# Patient Record
Sex: Male | Born: 1970 | Race: Black or African American | Hispanic: No | Marital: Married | Smoking: Never smoker
Health system: Southern US, Community
[De-identification: ages and names within clinical notes are randomized; demographics above are authoritative.]

## PROBLEM LIST (undated history)

## (undated) DIAGNOSIS — I10 Essential (primary) hypertension: Secondary | ICD-10-CM

## (undated) HISTORY — DX: Essential (primary) hypertension: I10

---

## 2012-06-09 ENCOUNTER — Other Ambulatory Visit: Payer: Self-pay | Admitting: Family Medicine

## 2012-06-10 NOTE — Telephone Encounter (Signed)
Patient's chart is at the nurses station in the pa pool pile.  [facility] [id number]

## 2012-06-10 NOTE — Telephone Encounter (Signed)
Need chart

## 2012-08-18 ENCOUNTER — Other Ambulatory Visit: Payer: Self-pay | Admitting: Physician Assistant

## 2012-08-18 NOTE — Telephone Encounter (Signed)
Please pull chart.

## 2012-08-20 NOTE — Telephone Encounter (Signed)
Needs OV, not seen in Epic - 2nd notice

## 2012-08-20 NOTE — Telephone Encounter (Signed)
Chart pulled to PA pool at nurses station [phone]

## 2013-01-18 ENCOUNTER — Encounter: Payer: Self-pay | Admitting: Family Medicine

## 2013-01-18 ENCOUNTER — Ambulatory Visit (INDEPENDENT_AMBULATORY_CARE_PROVIDER_SITE_OTHER): Payer: BC Managed Care – PPO | Admitting: Family Medicine

## 2013-01-18 VITALS — BP 140/96 | HR 81 | Temp 99.1°F | Resp 16 | Ht 77.0 in | Wt 323.4 lb

## 2013-01-18 DIAGNOSIS — E669 Obesity, unspecified: Secondary | ICD-10-CM

## 2013-01-18 DIAGNOSIS — I10 Essential (primary) hypertension: Secondary | ICD-10-CM

## 2013-01-18 MED ORDER — LISINOPRIL-HYDROCHLOROTHIAZIDE 20-25 MG PO TABS: 1.0000 | ORAL_TABLET | Freq: Every day | ORAL | Status: DC

## 2013-01-18 NOTE — Patient Instructions (Addendum)
Obesity Obesity is having too much body fat and a body mass index (BMI) of 30 or more. BMI is a number based on your height and weight. The number is an estimate of how much body fat you have. Obesity can happen if you eat more calories than you can burn by exercising or other activity. It can cause major health problems or emergencies.  HOME CARE  Exercise and be active as told by your doctor. Try:  Using stairs when you can.  Parking farther away from store doors.  Gardening, biking, or walking.  Eat healthy foods and drinks that are low in calories. Eat more fruits and vegetables.  Limit fast food, sweets, and snack foods that are made with ingredients that are not natural (processed food).  Eat smaller amounts of food.  Keep a journal and write down what you eat every day. Websites can help with this.  Avoid drinking alcohol. Drink more water and drinks without calories.   Take vitamins and dietary pills (supplements) only as told by your doctor.  Try going to weight-loss support groups or classes to help lessen stress. Dieticians and counselors may also help. GET HELP RIGHT AWAY IF:  You have chest pain or tightness.  You have trouble breathing or feel short of breath.  You feel weak or have loss of feeling (numbness) in your legs.  You feel confused or have trouble talking.  You have sudden changes in your vision. MAKE SURE YOU:  Understand these instructions.  Will watch your condition.  Will get help right away if you are not doing well or get worse. Document Released: 09/19/2011 Document Reviewed: 09/19/2011 ExitCare Patient Information 2014 [employer].  

## 2013-01-20 DIAGNOSIS — I10 Essential (primary) hypertension: Secondary | ICD-10-CM | POA: Insufficient documentation

## 2013-01-20 NOTE — Progress Notes (Signed)
S:  This 42 y.o. 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 He has elevated BP and was advised to follow-up for better HTN control. Pt has been complaint w/ medication and he and wife have instituted lifestyle changes (diet and exercise). Pt denies diaphoresis, fatigue, CP or tightness, palpitations, edema, HA, dizziness, numbness, weakness, vision changes or syncope.  PMHX, Soc Hx and Fam Hx reviewed.  ROS: As per HPI.  O:  Filed Vitals:   01/18/13 1007  BP: 140/96  Pulse: 81  Temp: 99.1 F (37.3 C)  Resp: 16   GEN: in NAD. WN,WD. HENT: [place]/AT. EOMI w/ clear conj and muddy sclerae. Otherwise unremarkable. COR: RRR. LUNGS: CTA. Normal resp rate and effort. MS: MAEs; no c/c/e. NEURO: A&O x 3; CNs intact. Nonfocal.  A/P: HTN (hypertension)- Continue current medication and lifestyle changes.  Obesity, unspecified- Encouraged weight loss.

## 2013-01-30 ENCOUNTER — Ambulatory Visit (INDEPENDENT_AMBULATORY_CARE_PROVIDER_SITE_OTHER): Payer: BC Managed Care – PPO | Admitting: Family Medicine

## 2013-01-30 VITALS — BP 147/93 | HR 91 | Temp 98.0°F | Resp 18 | Ht 76.0 in | Wt 321.0 lb

## 2013-01-30 DIAGNOSIS — I10 Essential (primary) hypertension: Secondary | ICD-10-CM

## 2013-01-30 DIAGNOSIS — M109 Gout, unspecified: Secondary | ICD-10-CM

## 2013-01-30 LAB — BASIC METABOLIC PANEL
BUN: 16 mg/dL (ref 6–23)
CO2: 33 mEq/L — ABNORMAL HIGH (ref 19–32)
Calcium: 9.8 mg/dL (ref 8.4–10.5)
Chloride: 100 mEq/L (ref 96–112)
Creat: 1.55 mg/dL — ABNORMAL HIGH (ref 0.50–1.35)
Glucose, Bld: 96 mg/dL (ref 70–99)
Potassium: 3.9 mEq/L (ref 3.5–5.3)
Sodium: 140 mEq/L (ref 135–145)

## 2013-01-30 LAB — URIC ACID: Uric Acid, Serum: 7.7 mg/dL (ref 4.0–7.8)

## 2013-01-30 MED ORDER — COLCHICINE 0.6 MG PO TABS: 0.6000 mg | ORAL_TABLET | Freq: Every day | ORAL | Status: DC

## 2013-01-30 NOTE — Patient Instructions (Signed)
Gout  Gout is an inflammatory condition (arthritis) caused by a buildup of uric acid crystals in the joints. Uric acid is a chemical that is normally present in the blood. Under some circumstances, uric acid can form into crystals in your joints. This causes joint redness, soreness, and swelling (inflammation). Repeat attacks are common. Over time, uric acid crystals can form into masses (tophi) near a joint, causing disfigurement. Gout is treatable and often preventable.  CAUSES   The disease begins with elevated levels of uric acid in the blood. Uric acid is produced by your body when it breaks down a naturally found substance called purines. This also happens when you eat certain foods such as meats and fish. Causes of an elevated uric acid level include:   Being passed down from parent to child (heredity).   Diseases that cause increased uric acid production (obesity, psoriasis, some cancers).   Excessive alcohol use.   Diet, especially diets rich in meat and seafood.   Medicines, including certain cancer-fighting drugs (chemotherapy), diuretics, and aspirin.   Chronic kidney disease. The kidneys are no longer able to remove uric acid well.   Problems with metabolism.  Conditions strongly associated with gout include:   Obesity.   High blood pressure.   High cholesterol.   Diabetes.  Not everyone with elevated uric acid levels gets gout. It is not understood why some people get gout and others do not. Surgery, joint injury, and eating too much of certain foods are some of the factors that can lead to gout.  SYMPTOMS    An attack of gout comes on quickly. It causes intense pain with redness, swelling, and warmth in a joint.   Fever can occur.   Often, only one joint is involved. Certain joints are more commonly involved:   Base of the big toe.   Knee.   Ankle.   Wrist.   Finger.  Without treatment, an attack usually goes away in a few days to weeks. Between attacks, you usually will not have  symptoms, which is different from many other forms of arthritis.  DIAGNOSIS   Your caregiver will suspect gout based on your symptoms and exam. Removal of fluid from the joint (arthrocentesis) is done to check for uric acid crystals. Your caregiver will give you a medicine that numbs the area (local anesthetic) and use a needle to remove joint fluid for exam. Gout is confirmed when uric acid crystals are seen in joint fluid, using a special microscope. Sometimes, blood, urine, and X-ray tests are also used.  TREATMENT   There are 2 phases to gout treatment: treating the sudden onset (acute) attack and preventing attacks (prophylaxis).  Treatment of an Acute Attack   Medicines are used. These include anti-inflammatory medicines or steroid medicines.   An injection of steroid medicine into the affected joint is sometimes necessary.   The painful joint is rested. Movement can worsen the arthritis.   You may use warm or cold treatments on painful joints, depending which works best for you.   Discuss the use of coffee, vitamin C, or cherries with your caregiver. These may be helpful treatment options.  Treatment to Prevent Attacks  After the acute attack subsides, your caregiver may advise prophylactic medicine. These medicines either help your kidneys eliminate uric acid from your body or decrease your uric acid production. You may need to stay on these medicines for a very long time.  The early phase of treatment with prophylactic medicine can be associated   with an increase in acute gout attacks. For this reason, during the first few months of treatment, your caregiver may also advise you to take medicines usually used for acute gout treatment. Be sure you understand your caregiver's directions.  You should also discuss dietary treatment with your caregiver. Certain foods such as meats and fish can increase uric acid levels. Other foods such as dairy can decrease levels. Your caregiver can give you a list of foods  to avoid.  HOME CARE INSTRUCTIONS    Do not take aspirin to relieve pain. This raises uric acid levels.   Only take over-the-counter or prescription medicines for pain, discomfort, or fever as directed by your caregiver.   Rest the joint as much as possible. When in bed, keep sheets and blankets off painful areas.   Keep the affected joint raised (elevated).   Use crutches if the painful joint is in your leg.   Drink enough water and fluids to keep your urine clear or pale yellow. This helps your body get rid of uric acid. Do not drink alcoholic beverages. They slow the passage of uric acid.   Follow your caregiver's dietary instructions. Pay careful attention to the amount of protein you eat. Your daily diet should emphasize fruits, vegetables, whole grains, and fat-free or low-fat milk products.   Maintain a healthy body weight.  SEEK MEDICAL CARE IF:    You have an oral temperature above 102 F (38.9 C).   You develop diarrhea, vomiting, or any side effects from medicines.   You do not feel better in 24 hours, or you are getting worse.  SEEK IMMEDIATE MEDICAL CARE IF:    Your joint becomes suddenly more tender and you have:   Chills.   An oral temperature above 102 F (38.9 C), not controlled by medicine.  MAKE SURE YOU:    Understand these instructions.   Will watch your condition.   Will get help right away if you are not doing well or get worse.  Document Released: 06/24/2000 Document Revised: 09/19/2011 Document Reviewed: 10/05/2009  ExitCare Patient Information 2014 [employer].

## 2013-01-30 NOTE — Progress Notes (Signed)
[facility]:  Office Visit  Chief Complaint:  Chief Complaint  Patient presents with  . Toe Pain    right, since Sunday    HPI: Derrick Cain is a 42 y.o. male who complains of  Right big toe,painful. He has had gout before in 2009. Has taken ibuprofen and also drinking and cherry products to help with gout, it helps some. Marland Kitchen He has constant sharp, dull, aching pain in big toes,it is worse with weight bearing but is constant. NKI. 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 He is supposed to come back in September with Dr. Audria Nine. He has gained weight 20 lbs. Since starting 9 pm- 6 am shift at [employer]. Does not take BP at home  Past Medical History  Diagnosis Date  . Hypertension    History reviewed. No pertinent past surgical history. History   Social History  . Marital Status: Married    Spouse Name: N/A    Number of Children: N/A  . Years of Education: N/A   Occupational History  . [employer]   Social History Main Topics  . Smoking status: Never Smoker   . Smokeless tobacco: None  . Alcohol Use: No  . Drug Use: No  . Sexually Active: Yes   Other Topics Concern  . None   Social History Narrative   Married with children. Education: [employer].    Family History  Problem Relation Age of Onset  . Hypertension Mother   . Diabetes Father    No Known Allergies Prior to Admission medications   Medication Sig Start Date End Date Taking? Authorizing Provider  lisinopril-hydrochlorothiazide (PRINZIDE,ZESTORETIC) 20-25 MG per tablet Take 1 tablet by mouth daily. 01/18/13   Maurice March, MD     ROS: The patient denies fevers, chills, night sweats, unintentional weight loss, chest pain, palpitations, wheezing, dyspnea on exertion, nausea, vomiting, abdominal pain, dysuria, hematuria, melena, numbness, weakness, or tingling.  All other systems have been reviewed and were  otherwise negative with the exception of those mentioned in the HPI and as above.    PHYSICAL EXAM: Filed Vitals:   01/30/13 0917  BP: 147/93  Pulse: 91  Temp: 98 F (36.7 C)  Resp: 18   Filed Vitals:   01/30/13 0917  Height: 6\' 4"  (1.93 m)  Weight: 321 lb (145.605 kg)   Body mass index is 39.09 kg/(m^2).  General: Alert, no acute distress HEENT:  Normocephalic, atraumatic, oropharynx patent.  Cardiovascular:  Regular rate and rhythm, no rubs murmurs or gallops.  No Carotid bruits, radial pulse intact. No pedal edema.  Respiratory: Clear to auscultation bilaterally.  No wheezes, rales, or rhonchi.  No cyanosis, no use of accessory musculature GI: No organomegaly, abdomen is soft and non-tender, positive bowel sounds.  No masses. Skin: No rashes. Neurologic: Facial musculature symmetric. Psychiatric: Patient is appropriate throughout our interaction. Lymphatic: No cervical lymphadenopathy Musculoskeletal: Gait intact. Right MTP swollen, minimally erytheamtosu and warm, fukk ROM, tender, + swelling   LABS: No results found for this or any previous visit.   EKG/XRAY:   Primary read interpreted by Dr. Conley Rolls at [facility].   ASSESSMENT/PLAN: Encounter Diagnoses  Name Primary?  . Gout of big toe Yes  . HTN (hypertension)    Rx Colcrys Recheck HTN since needs to get it under better control BMP and uric acid pending F/u in 1 week  Euline Kimbler PHUONG, DO 01/30/2013 10:09 AM

## 2013-02-05 ENCOUNTER — Ambulatory Visit (INDEPENDENT_AMBULATORY_CARE_PROVIDER_SITE_OTHER): Payer: BC Managed Care – PPO | Admitting: Family Medicine

## 2013-02-05 VITALS — BP 152/90 | HR 74 | Temp 97.6°F | Resp 18 | Ht 78.0 in | Wt 320.0 lb

## 2013-02-05 DIAGNOSIS — Z79899 Other long term (current) drug therapy: Secondary | ICD-10-CM

## 2013-02-05 DIAGNOSIS — M109 Gout, unspecified: Secondary | ICD-10-CM | POA: Insufficient documentation

## 2013-02-05 DIAGNOSIS — I10 Essential (primary) hypertension: Secondary | ICD-10-CM

## 2013-02-05 DIAGNOSIS — E669 Obesity, unspecified: Secondary | ICD-10-CM

## 2013-02-05 MED ORDER — AMLODIPINE BESYLATE 5 MG PO TABS: 5.0000 mg | ORAL_TABLET | Freq: Every day | ORAL | Status: DC

## 2013-02-05 MED ORDER — LISINOPRIL 40 MG PO TABS: 40.0000 mg | ORAL_TABLET | Freq: Every day | ORAL | Status: DC

## 2013-02-05 NOTE — Progress Notes (Signed)
  Subjective:    Patient ID: Derrick Cain., male    DOB: [birth year], 42 y.o.   MRN: [id number] Chief Complaint  Patient presents with  . Follow-up    htn     HPI   No prob w/ gout until after he started on the hctz medication.  Lost home BP cuff but does have wrist one that seems pretty accurate.  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   Taking 600mg  to 1000mg  of ibuprofen once a day for gout.  Past Medical History  Diagnosis Date  . Hypertension    Current Outpatient Prescriptions on File Prior to Visit  Medication Sig Dispense Refill  . colchicine 0.6 MG tablet Take 1 tablet (0.6 mg total) by mouth daily.  30 tablet  0   No current facility-administered medications on file prior to visit.   No Known Allergies    Review of Systems  Constitutional: Negative for fever and chills.  Eyes: Negative for visual disturbance.  Respiratory: Negative for shortness of breath.   Cardiovascular: Negative for chest pain and leg swelling.  Musculoskeletal: Positive for joint swelling and arthralgias.  Skin: Negative for color change and rash.  Neurological: Negative for dizziness, syncope, facial asymmetry, weakness, light-headedness and headaches.      BP 152/90  Pulse 74  Temp(Src) 97.6 F (36.4 C) (Oral)  Resp 18  Ht 6\' 6"  (1.981 m)  Wt 320 lb (145.151 kg)  BMI 36.99 kg/m2  SpO2 98% Objective:   Physical Exam  Constitutional: He is oriented to person, place, and time. He appears well-developed and well-nourished. No distress.  HENT:  Head: Normocephalic and atraumatic.  Eyes: Conjunctivae are normal. Pupils are equal, round, and reactive to light. No scleral icterus.  Neck: Normal range of motion. Neck supple. No thyromegaly present.  Cardiovascular: Normal rate, regular rhythm, normal heart sounds and intact distal pulses.   Pulmonary/Chest: Effort normal and breath sounds normal. No respiratory distress.  Musculoskeletal: He  exhibits no edema.  Lymphadenopathy:    He has no cervical adenopathy.  Neurological: He is alert and oriented to person, place, and time.  Skin: Skin is warm and dry. He is not diaphoretic.  Psychiatric: He has a normal mood and affect. His behavior is normal.          Assessment & Plan:  HTN (hypertension) - Plan: Comprehensive metabolic panel, Lipid panel - d/c lisinopril/hctz 20/25 due to continued elev BP - increase lisinopril and trial of amlodipine. Check BP at home and RTC in 2-3 wks for BP recheck and cons repeat bmp due to acei increase.  Obesity, unspecified  Encounter for long-term (current) use of other medications  Gout of big toe - No more ibuprofen than 800mg  - renal function w/ Cr of 1.55 shows eGFR 65.  Consider trial of allopurinol if cont but hopefully elimination of hctz will help.  Meds ordered this encounter  Medications  . lisinopril (PRINIVIL,ZESTRIL) 40 MG tablet    Sig: Take 1 tablet (40 mg total) by mouth daily.    Dispense:  90 tablet    Refill:  0  . amLODipine (NORVASC) 5 MG tablet    Sig: Take 1 tablet (5 mg total) by mouth daily.    Dispense:  90 tablet    Refill:  0

## 2013-02-05 NOTE — Patient Instructions (Addendum)
DASH Diet  The DASH diet stands for "Dietary Approaches to Stop Hypertension." It is a healthy eating plan that has been shown to reduce high blood pressure (hypertension) in as little as 14 days, while also possibly providing other significant health benefits. These other health benefits include reducing the risk of breast cancer after menopause and reducing the risk of type 2 diabetes, heart disease, colon cancer, and stroke. Health benefits also include weight loss and slowing kidney failure in patients with chronic kidney disease.   DIET GUIDELINES  · Limit salt (sodium). Your diet should contain less than 1500 mg of sodium daily.  · Limit refined or processed carbohydrates. Your diet should include mostly whole grains. Desserts and added sugars should be used sparingly.  · Include small amounts of heart-healthy fats. These types of fats include nuts, oils, and tub margarine. Limit saturated and trans fats. These fats have been shown to be harmful in the body.  CHOOSING FOODS   The following food groups are based on a 2000 calorie diet. See your Registered Dietitian for individual calorie needs.  Grains and Grain Products (6 to 8 servings daily)  · Eat More Often: Whole-wheat bread, brown rice, whole-grain or wheat pasta, quinoa, popcorn without added fat or salt (air popped).  · Eat Less Often: White bread, white pasta, white rice, cornbread.  Vegetables (4 to 5 servings daily)  · Eat More Often: Fresh, frozen, and canned vegetables. Vegetables may be raw, steamed, roasted, or grilled with a minimal amount of fat.  · Eat Less Often/Avoid: Creamed or fried vegetables. Vegetables in a cheese sauce.  Fruit (4 to 5 servings daily)  · Eat More Often: All fresh, canned (in natural juice), or frozen fruits. Dried fruits without added sugar. One hundred percent fruit juice (½ cup [237 mL] daily).  · Eat Less Often: Dried fruits with added sugar. Canned fruit in light or heavy syrup.  Lean Meats, Fish, and Poultry (2  servings or less daily. One serving is 3 to 4 oz [85-114 g]).  · Eat More Often: Ninety percent or leaner ground beef, tenderloin, sirloin. Round cuts of beef, chicken breast, turkey breast. All fish. Grill, bake, or broil your meat. Nothing should be fried.  · Eat Less Often/Avoid: Fatty cuts of meat, turkey, or chicken leg, thigh, or wing. Fried cuts of meat or fish.  Dairy (2 to 3 servings)  · Eat More Often: Low-fat or fat-free milk, low-fat plain or light yogurt, reduced-fat or part-skim cheese.  · Eat Less Often/Avoid: Milk (whole, 2%). Whole milk yogurt. Full-fat cheeses.  Nuts, Seeds, and Legumes (4 to 5 servings per week)  · Eat More Often: All without added salt.  · Eat Less Often/Avoid: Salted nuts and seeds, canned beans with added salt.  Fats and Sweets (limited)  · Eat More Often: Vegetable oils, tub margarines without trans fats, sugar-free gelatin. Mayonnaise and salad dressings.  · Eat Less Often/Avoid: Coconut oils, palm oils, butter, stick margarine, cream, half and half, cookies, candy, pie.  FOR MORE INFORMATION  The Dash Diet Eating Plan: www.dashdiet.org  Document Released: 06/16/2011 Document Revised: 09/19/2011 Document Reviewed: 06/16/2011  ExitCare® Patient Information ©2014 [employer].

## 2013-02-06 LAB — LIPID PANEL
Cholesterol: 177 mg/dL (ref 0–200)
HDL: 32 mg/dL — ABNORMAL LOW (ref >39)
LDL Cholesterol: 123 mg/dL — ABNORMAL HIGH (ref 0–99)
Total CHOL/HDL Ratio: 5.5 Ratio
Triglycerides: 110 mg/dL (ref <150)
VLDL: 22 mg/dL (ref 0–40)

## 2013-02-06 LAB — COMPREHENSIVE METABOLIC PANEL
ALT: 29 U/L (ref 0–53)
AST: 26 U/L (ref 0–37)
Albumin: 4.3 g/dL (ref 3.5–5.2)
Alkaline Phosphatase: 89 U/L (ref 39–117)
BUN: 18 mg/dL (ref 6–23)
CO2: 33 mEq/L — ABNORMAL HIGH (ref 19–32)
Calcium: 10 mg/dL (ref 8.4–10.5)
Chloride: 100 mEq/L (ref 96–112)
Creat: 1.5 mg/dL — ABNORMAL HIGH (ref 0.50–1.35)
Glucose, Bld: 99 mg/dL (ref 70–99)
Potassium: 4.1 mEq/L (ref 3.5–5.3)
Sodium: 140 mEq/L (ref 135–145)
Total Bilirubin: 0.5 mg/dL (ref 0.3–1.2)
Total Protein: 7.7 g/dL (ref 6.0–8.3)

## 2013-02-21 ENCOUNTER — Encounter: Payer: Self-pay | Admitting: Family Medicine

## 2013-05-10 ENCOUNTER — Other Ambulatory Visit: Payer: Self-pay | Admitting: Family Medicine

## 2013-07-26 ENCOUNTER — Encounter: Payer: BC Managed Care – PPO | Admitting: Family Medicine

## 2013-08-15 ENCOUNTER — Other Ambulatory Visit: Payer: Self-pay | Admitting: Physician Assistant

## 2013-09-14 ENCOUNTER — Other Ambulatory Visit: Payer: Self-pay | Admitting: Physician Assistant

## 2013-09-16 ENCOUNTER — Other Ambulatory Visit: Payer: Self-pay

## 2013-09-16 MED ORDER — AMLODIPINE BESYLATE 5 MG PO TABS: 5.0000 mg | ORAL_TABLET | Freq: Every day | ORAL | Status: DC

## 2013-09-16 MED ORDER — LISINOPRIL 40 MG PO TABS: 40.0000 mg | ORAL_TABLET | Freq: Every day | ORAL | Status: DC

## 2013-09-24 ENCOUNTER — Ambulatory Visit (INDEPENDENT_AMBULATORY_CARE_PROVIDER_SITE_OTHER): Payer: BC Managed Care – PPO | Admitting: Family Medicine

## 2013-09-24 ENCOUNTER — Encounter: Payer: Self-pay | Admitting: Family Medicine

## 2013-09-24 VITALS — BP 158/94 | HR 86 | Temp 98.4°F | Resp 16 | Ht 77.0 in | Wt 322.8 lb

## 2013-09-24 DIAGNOSIS — E049 Nontoxic goiter, unspecified: Secondary | ICD-10-CM

## 2013-09-24 DIAGNOSIS — E669 Obesity, unspecified: Secondary | ICD-10-CM

## 2013-09-24 DIAGNOSIS — I10 Essential (primary) hypertension: Secondary | ICD-10-CM

## 2013-09-24 DIAGNOSIS — R0683 Snoring: Secondary | ICD-10-CM

## 2013-09-24 DIAGNOSIS — R0609 Other forms of dyspnea: Secondary | ICD-10-CM

## 2013-09-24 DIAGNOSIS — R0989 Other specified symptoms and signs involving the circulatory and respiratory systems: Secondary | ICD-10-CM

## 2013-09-24 DIAGNOSIS — E010 Iodine-deficiency related diffuse (endemic) goiter: Secondary | ICD-10-CM

## 2013-09-24 DIAGNOSIS — Z0000 Encounter for general adult medical examination without abnormal findings: Secondary | ICD-10-CM

## 2013-09-24 LAB — POCT URINALYSIS DIPSTICK
Bilirubin, UA: NEGATIVE
Glucose, UA: NEGATIVE
Leukocytes, UA: NEGATIVE
Nitrite, UA: NEGATIVE
Protein, UA: 300
Spec Grav, UA: 1.025
Urobilinogen, UA: 1
pH, UA: 6.5

## 2013-09-24 LAB — COMPLETE METABOLIC PANEL WITH GFR
ALT: 38 U/L (ref 0–53)
AST: 24 U/L (ref 0–37)
Albumin: 3.8 g/dL (ref 3.5–5.2)
Alkaline Phosphatase: 104 U/L (ref 39–117)
BUN: 10 mg/dL (ref 6–23)
CO2: 29 mEq/L (ref 19–32)
Calcium: 9.4 mg/dL (ref 8.4–10.5)
Chloride: 104 mEq/L (ref 96–112)
Creat: 1.44 mg/dL — ABNORMAL HIGH (ref 0.50–1.35)
GFR, Est African American: 69 mL/min
GFR, Est Non African American: 60 mL/min
Glucose, Bld: 95 mg/dL (ref 70–99)
Potassium: 4.3 mEq/L (ref 3.5–5.3)
Sodium: 141 mEq/L (ref 135–145)
Total Bilirubin: 0.6 mg/dL (ref 0.2–1.2)
Total Protein: 7.1 g/dL (ref 6.0–8.3)

## 2013-09-24 LAB — POCT GLYCOSYLATED HEMOGLOBIN (HGB A1C): Hemoglobin A1C: 5.6

## 2013-09-24 MED ORDER — LISINOPRIL 40 MG PO TABS: 40.0000 mg | ORAL_TABLET | Freq: Every day | ORAL | Status: DC

## 2013-09-24 MED ORDER — AMLODIPINE BESYLATE 5 MG PO TABS: 5.0000 mg | ORAL_TABLET | Freq: Every day | ORAL | Status: DC

## 2013-09-24 NOTE — Patient Instructions (Signed)
Keeping you healthy  Get these tests  Blood pressure- Have your blood pressure checked once a year by your healthcare provider.  Normal blood pressure is 120/80.  Weight- Have your body mass index (BMI) calculated to screen for obesity.  BMI is a measure of body fat based on height and weight. You can also calculate your own BMI at https://www.west-esparza.com/www.nhlbisupport.com/bmi/.  Cholesterol- Have your cholesterol checked regularly starting at age 42, sooner may be necessary if you have diabetes, high blood pressure, if a family member developed heart diseases at an early age or if you smoke.   Chlamydia, HIV, and other sexual transmitted disease- Get screened each year until the age of 42 then within three months of each new sexual partner.  Diabetes- Have your blood sugar checked regularly if you have high blood pressure, high cholesterol, a family history of diabetes or if you are overweight.  Get these vaccines  Flu shot- Every fall.  Tetanus shot- Every 10 years. Tdap- October 2009; next Tetanus due in 2019.   Take these steps  Don't smoke- If you do smoke, ask your healthcare provider about quitting. For tips on how to quit, go to www.smokefree.gov or call 1-800-QUIT-NOW.  Be physically active- Exercise 5 days a week for at least 30 minutes.  If you are not already physically active start slow and gradually work up to 30 minutes of moderate physical activity.  Examples of moderate activity include walking briskly, mowing the yard, dancing, swimming bicycling, etc.  Eat a healthy diet- Eat a variety of healthy foods such as fruits, vegetables, low fat milk, low fat cheese, yogurt, lean meats, poultry, fish, beans, tofu, etc.  For more information on healthy eating, go to www.thenutritionsource.org  Drink alcohol in moderation- Limit alcohol intake two drinks or less a day.  Never drink and drive.  Dentist- Brush and floss teeth twice daily; visit your dentis twice a year.  Depression-Your emotional  health is as important as your physical health.  If you're feeling down, losing interest in things you normally enjoy please talk with your healthcare provider.  Gun Safety- If you keep a gun in your home, keep it unloaded and with the safety lock on.  Bullets should be stored separately.  Helmet use- Always wear a helmet when riding a motorcycle, bicycle, rollerblading or skateboarding.  Safe sex- If you may be exposed to a sexually transmitted infection, use a condom  Seat belts- Seat bels can save your life; always wear one.  Smoke/Carbon Monoxide detectors- These detectors need to be installed on the appropriate level of your home.  Replace batteries at least once a year.  Skin Cancer- When out in the sun, cover up and use sunscreen SPF 15 or higher.  Violence- If anyone is threatening or hurting you, please tell your healthcare provider.   Electrocardiography Electrocardiograhy is a test to check the heart. It looks at how your heart beats. It is done if:   You are having a heart attack.  You may have had a heart attack in the past.  You are having a health check-up. PROCEDURE  This test is easy and painless.  You will remove your clothes from the waist up, wear a hospital gown, and lie down on your back.  Small round pads (electrodes) will be placed on your chest, arms, and legs. The round pads are attached to wires that go to a machine. The machine records the electrical activity of your heart.  You will be asked to  relax and lie very still.  The test takes a few minutes. AFTER THE PROCEDURE  If the test was done as part of a routine exam, you can go back to your normal activity as told by your doctor.  Your results will be looked at by a heart doctor (cardiologist).  Ask when your test results will be ready. Make sure you get your test results. Document Released: 06/09/2008 Document Revised: 03/21/2012 Document Reviewed: 11/07/2011 [facility] Patient Information  2014 [place], [place].   Exercise to Lose Weight Exercise and a healthy diet may help you lose weight. Your doctor may suggest specific exercises. EXERCISE IDEAS AND TIPS  Choose low-cost things you enjoy doing, such as walking, bicycling, or exercising to workout videos.  Take stairs instead of the elevator.  Walk during your lunch break.  Park your car further away from work or school.  Go to a gym or an exercise class.  Start with 5 to 10 minutes of exercise each day. Build up to 30 minutes of exercise 4 to 6 days a week.  Wear shoes with good support and comfortable clothes.  Stretch before and after working out.  Work out until you breathe harder and your heart beats faster.  Drink extra water when you exercise.  Do not do so much that you hurt yourself, feel dizzy, or get very short of breath. Exercises that burn about 150 calories:  Running 1  miles in 15 minutes.  Playing volleyball for 45 to 60 minutes.  Washing and waxing a car for 45 to 60 minutes.  Playing touch football for 45 minutes.  Walking 1  miles in 35 minutes.  Pushing a stroller 1  miles in 30 minutes.  Playing basketball for 30 minutes.  Raking leaves for 30 minutes.  Bicycling 5 miles in 30 minutes.  Walking 2 miles in 30 minutes.  Dancing for 30 minutes.  Shoveling snow for 15 minutes.  Swimming laps for 20 minutes.  Walking up stairs for 15 minutes.  Bicycling 4 miles in 15 minutes.  Gardening for 30 to 45 minutes.  Jumping rope for 15 minutes.  Washing windows or floors for 45 to 60 minutes. Document Released: 07/30/2010 Document Revised: 09/19/2011 Document Reviewed: 07/30/2010 [facility] Patient Information 2014 [place], [place].   THE MEDITERRANEAN DIET is a great guide for healthy eating and weight loss. Avoid sweets, excessive starches, processed and fatty foods and refined sugars.

## 2013-09-24 NOTE — Progress Notes (Signed)
Subjective:    Patient ID: Derrick Rouxsaac Miotke Jr., male    DOB: Aug 01, 1970, 43 y.o.   MRN: [id number]  HPI  This 43 y.o. AA male is here for CPE. He has HTN; checks BP at home- SBP 140-150. Pt has changed diet and has intermittently increased physical activity. He works 3rd shift.  Per pt, wife reports he snores.  HCM: Vision- No recent eye exam.           IMM- Tdap current.    Patient Active Problem List   Diagnosis Date Noted  . Encounter for long-term (current) use of other medications 02/05/2013  . Gout of big toe 02/05/2013  . HTN (hypertension) 01/20/2013  . Obesity, unspecified 01/20/2013   Prior to Admission medications   Medication Sig Start Date End Date Taking? Authorizing Provider  amLODipine (NORVASC) 5 MG tablet Take 1 tablet (5 mg total) by mouth daily. PATIENT NEEDS OFFICE VISIT FOR ADDITIONAL REFILLS - 2nd NOTICE 09/16/13  Yes Maurice MarchBarbara B Branko Steeves, MD  lisinopril (PRINIVIL,ZESTRIL) 40 MG tablet Take 1 tablet (40 mg total) by mouth daily. PATIENT NEEDS OFFICE VISIT FOR ADDITIONAL REFILLS - 2nd NOTICE 09/16/13  Yes Maurice MarchBarbara B Delfina Schreurs, MD  colchicine 0.6 MG tablet Take 1 tablet (0.6 mg total) by mouth daily. 01/30/13   Thao P Le, DO   PMHx, Surg Hx, Soc and Fam Hx reviewed. Pt states his mother gives a hx of 2 maternal uncles with "some type of cancer" and that he should be screened.   Review of Systems  Constitutional: Negative.   HENT: Negative.   Eyes: Negative.   Respiratory: Negative.   Cardiovascular: Negative.   Gastrointestinal: Negative.   Endocrine: Negative.   Genitourinary: Negative.   Musculoskeletal: Negative.   Skin: Negative.   Allergic/Immunologic: Negative.   Neurological: Negative.   Hematological: Negative.   Psychiatric/Behavioral: Negative.       Objective:   Physical Exam  Nursing note and vitals reviewed. Constitutional: He is oriented to person, place, and time. He appears well-developed and well-nourished. No distress.  HENT:  Head:  Normocephalic and atraumatic.  Right Ear: Hearing and ear canal normal.  Left Ear: Hearing and ear canal normal.  Nose: Nose normal. No nasal deformity or septal deviation.  Mouth/Throat: Uvula is midline, oropharynx is clear and moist and mucous membranes are normal. No oral lesions. Normal dentition.  Canals w/ excessive cerumen.  Eyes: EOM and lids are normal. Pupils are equal, round, and reactive to light. Right conjunctiva is injected. Left conjunctiva is injected. No scleral icterus.  Neck: Trachea normal, normal range of motion and full passive range of motion without pain. Neck supple. No JVD present. No spinous process tenderness and no muscular tenderness present. Thyromegaly present. No mass present.  Cardiovascular: Normal rate, regular rhythm, S1 normal, S2 normal, normal heart sounds and normal pulses.   No extrasystoles are present. Exam reveals no gallop and no friction rub.   No murmur heard. Pulmonary/Chest: Effort normal and breath sounds normal. No respiratory distress.  Abdominal: Soft. Bowel sounds are normal. He exhibits no distension, no abdominal bruit, no pulsatile midline mass and no mass. There is no hepatosplenomegaly. There is no tenderness. There is no guarding and no CVA tenderness. No hernia.  Abdomen- increased girth.  Genitourinary:  Deferred.  Musculoskeletal: Normal range of motion. He exhibits no edema and no tenderness.       Cervical back: Normal.       Thoracic back: Normal.  Lumbar back: Normal.  Lymphadenopathy:       Head (right side): No submental, no submandibular, no tonsillar, no posterior auricular and no occipital adenopathy present.       Head (left side): No submental, no submandibular, no tonsillar, no posterior auricular and no occipital adenopathy present.    He has no cervical adenopathy.       Right: No inguinal and no supraclavicular adenopathy present.       Left: No inguinal and no supraclavicular adenopathy present.    Neurological: He is alert and oriented to person, place, and time. He has normal strength. He displays no atrophy and no tremor. No cranial nerve deficit or sensory deficit. He exhibits normal muscle tone. He displays a negative Romberg sign. Coordination and gait normal.  DTRs difficult to assess.  Skin: Skin is warm, dry and intact. No rash noted. He is not diaphoretic. No cyanosis or erythema. Nails show no clubbing.  Psychiatric: He has a normal mood and affect. His speech is normal and behavior is normal. Thought content normal. Cognition and memory are normal.    Results for orders placed in visit on 09/24/13  POCT GLYCOSYLATED HEMOGLOBIN (HGB A1C)      Result Value Ref Range   Hemoglobin A1C 5.6    POCT URINALYSIS DIPSTICK      Result Value Ref Range   Color, UA yellow     Clarity, UA clear     Glucose, UA neg     Bilirubin, UA neg     Ketones, UA trace     Spec Grav, UA 1.025     Blood, UA trace     pH, UA 6.5     Protein, UA 300     Urobilinogen, UA 1.0     Nitrite, UA neg     Leukocytes, UA Negative     ECG: NSR; no ST-TW changes . No ectopy.     Assessment & Plan:  Routine general medical examination at a health care facility - Plan: POCT glycosylated hemoglobin (Hb A1C), COMPLETE METABOLIC PANEL WITH GFR, Vitamin D 1,25 dihydroxy, Thyroid Panel With TSH, EKG 12-Lead, POCT urinalysis dipstick  Thyromegaly - Plan: Thyroid Panel With TSH  HTN (hypertension) - Encouraged TLCs especially weight reduction. Plan: COMPLETE METABOLIC PANEL WITH GFR, EKG 12-Lead  Snorings- Advised pt to get more info from wife about his sleep pattern; need to consider Sleep Study.  Obesity, unspecified - Plan: POCT glycosylated hemoglobin (Hb A1C)  Meds ordered this encounter  Medications  . amLODipine (NORVASC) 5 MG tablet    Sig: Take 1 tablet (5 mg total) by mouth daily.    Dispense:  30 tablet    Refill:  5  . lisinopril (PRINIVIL,ZESTRIL) 40 MG tablet    Sig: Take 1 tablet (40  mg total) by mouth daily.    Dispense:  30 tablet    Refill:  5

## 2013-09-25 LAB — THYROID PANEL WITH TSH
Free Thyroxine Index: 3.3 (ref 1.0–3.9)
T3 Uptake: 36.2 % (ref 22.5–37.0)
T4, Total: 9.2 ug/dL (ref 5.0–12.5)
TSH: 1.414 u[IU]/mL (ref 0.350–4.500)

## 2013-09-27 LAB — VITAMIN D 1,25 DIHYDROXY
Vitamin D 1, 25 (OH)2 Total: 65 pg/mL (ref 18–72)
Vitamin D2 1, 25 (OH)2: <8 pg/mL
Vitamin D3 1, 25 (OH)2: 65 pg/mL

## 2013-09-27 NOTE — Progress Notes (Signed)
Quick Note:  Please advise pt regarding following labs... All labs look good; kidney function test is slightly above normal. Stay well hydrate with water and limit protein/ meat in diet.  Vitamin D is normal and thyroid function tests are normal. Continue current medications.   Copy to pt. ______

## 2013-10-09 ENCOUNTER — Other Ambulatory Visit: Payer: Self-pay

## 2013-10-09 MED ORDER — LISINOPRIL 40 MG PO TABS: 40.0000 mg | ORAL_TABLET | Freq: Every day | ORAL | Status: DC

## 2013-10-09 MED ORDER — AMLODIPINE BESYLATE 5 MG PO TABS: 5.0000 mg | ORAL_TABLET | Freq: Every day | ORAL | Status: DC

## 2013-12-25 ENCOUNTER — Ambulatory Visit: Payer: BC Managed Care – PPO | Admitting: Family Medicine

## 2014-04-13 ENCOUNTER — Other Ambulatory Visit: Payer: Self-pay | Admitting: Family Medicine

## 2014-05-14 ENCOUNTER — Other Ambulatory Visit: Payer: Self-pay | Admitting: Family Medicine

## 2014-05-30 ENCOUNTER — Other Ambulatory Visit: Payer: Self-pay | Admitting: Family Medicine

## 2014-05-30 NOTE — Telephone Encounter (Signed)
PATIENT NEEDS TO SCHEDULE A FOLLOW BEFORE REFILLS CAN BE CALLED IN.

## 2014-06-10 ENCOUNTER — Ambulatory Visit: Payer: BC Managed Care – PPO | Admitting: Family Medicine

## 2014-06-12 ENCOUNTER — Ambulatory Visit (INDEPENDENT_AMBULATORY_CARE_PROVIDER_SITE_OTHER): Payer: BC Managed Care – PPO | Admitting: Family Medicine

## 2014-06-12 ENCOUNTER — Encounter: Payer: Self-pay | Admitting: Family Medicine

## 2014-06-12 VITALS — BP 174/96 | HR 105 | Temp 98.0°F | Resp 16 | Ht 77.5 in | Wt 337.0 lb

## 2014-06-12 DIAGNOSIS — I10 Essential (primary) hypertension: Secondary | ICD-10-CM

## 2014-06-12 DIAGNOSIS — R635 Abnormal weight gain: Secondary | ICD-10-CM

## 2014-06-12 DIAGNOSIS — N289 Disorder of kidney and ureter, unspecified: Secondary | ICD-10-CM

## 2014-06-12 LAB — GLUCOSE, POCT (MANUAL RESULT ENTRY): POC Glucose: 106 mg/dl — AB (ref 70–99)

## 2014-06-12 LAB — BASIC METABOLIC PANEL
BUN: 15 mg/dL (ref 6–23)
CO2: 27 mEq/L (ref 19–32)
Calcium: 8.9 mg/dL (ref 8.4–10.5)
Chloride: 105 mEq/L (ref 96–112)
Creat: 1.43 mg/dL — ABNORMAL HIGH (ref 0.50–1.35)
Glucose, Bld: 103 mg/dL — ABNORMAL HIGH (ref 70–99)
Potassium: 3.7 mEq/L (ref 3.5–5.3)
Sodium: 141 mEq/L (ref 135–145)

## 2014-06-12 LAB — POCT GLYCOSYLATED HEMOGLOBIN (HGB A1C): Hemoglobin A1C: 6

## 2014-06-12 MED ORDER — BLOOD PRESSURE MONITOR/WRIST DEVI: 1.0000 | Freq: Every day | Status: DC

## 2014-06-12 MED ORDER — AMLODIPINE BESYLATE 10 MG PO TABS: ORAL_TABLET | ORAL | Status: DC

## 2014-06-12 MED ORDER — LISINOPRIL 40 MG PO TABS: 40.0000 mg | ORAL_TABLET | Freq: Every day | ORAL | Status: DC

## 2014-06-12 NOTE — Patient Instructions (Signed)
Your blood sugar test indicates that your have impaired glucose tolerance. This can be managed with better nutrition and weight loss. Try to get some exercise when you can; start with walking 20-30 minutes 3 times a week.       Mediterranean Diet  Why follow it? Research shows. . Those who follow the Mediterranean diet have a reduced risk of heart disease  . The diet is associated with a reduced incidence of Parkinson's and Alzheimer's diseases . People following the diet may have longer life expectancies and lower rates of chronic diseases  . The Dietary Guidelines for Americans recommends the Mediterranean diet as an eating plan to promote health and prevent disease  What Is the Mediterranean Diet?  . Healthy eating plan based on typical foods and recipes of Mediterranean-style cooking . The diet is primarily a plant based diet; these foods should make up a majority of meals   Starches - Plant based foods should make up a majority of meals - They are an important sources of vitamins, minerals, energy, antioxidants, and fiber - Choose whole grains, foods high in fiber and minimally processed items  - Typical grain sources include wheat, oats, barley, corn, brown rice, bulgar, farro, millet, polenta, couscous  - Various types of beans include chickpeas, lentils, fava beans, black beans, white beans   Fruits  Veggies - Large quantities of antioxidant rich fruits & veggies; 6 or more servings  - Vegetables can be eaten raw or lightly drizzled with oil and cooked  - Vegetables common to the traditional Mediterranean Diet include: artichokes, arugula, beets, broccoli, brussel sprouts, cabbage, carrots, celery, collard greens, cucumbers, eggplant, kale, leeks, lemons, lettuce, mushrooms, okra, onions, peas, peppers, potatoes, pumpkin, radishes, rutabaga, shallots, spinach, sweet potatoes, turnips, zucchini - Fruits common to the Mediterranean Diet include: apples, apricots, avocados, cherries,  clementines, dates, figs, grapefruits, grapes, melons, nectarines, oranges, peaches, pears, pomegranates, strawberries, tangerines  Fats - Replace butter and margarine with healthy oils, such as olive oil, canola oil, and tahini  - Limit nuts to no more than a handful a day  - Nuts include walnuts, almonds, pecans, pistachios, pine nuts  - Limit or avoid candied, honey roasted or heavily salted nuts - Olives are central to the [employer]iterranean diet - can be eaten whole or used in a variety of dishes   Meats Protein - Limiting red meat: no more than a few times a month - When eating red meat: choose lean cuts and keep the portion to the size of deck of cards - Eggs: approx. 0 to 4 times a week  - Fish and lean poultry: at least 2 a week  - Healthy protein sources include, chicken, [place]key, lean beef, lamb - Increase intake of seafood such as tuna, salmon, trout, mackerel, shrimp, scallops - Avoid or limit high fat processed meats such as sausage and bacon  Dairy - Include moderate amounts of low fat dairy products  - Focus on healthy dairy such as fat free yogurt, skim milk, low or reduced fat cheese - Limit dairy products higher in fat such as whole or 2% milk, cheese, ice cream  Alcohol - Moderate amounts of red wine is ok  - No more than 5 oz daily for women (all ages) and men older than age 42  - No more than 10 oz of wine daily for men younger than 6765  Other - Limit sweets and other desserts  - Use herbs and spices instead of salt to flavor foods  -  Herbs and spices common to the traditional Mediterranean Diet include: basil, bay leaves, chives, cloves, cumin, fennel, garlic, lavender, marjoram, mint, oregano, parsley, pepper, rosemary, sage, savory, sumac, tarragon, thyme   It's not just a diet, it's a lifestyle:  . The Mediterranean diet includes lifestyle factors typical of those in the region  . Foods, drinks and meals are best eaten with others and savored . Daily physical activity is  important for overall good health . This could be strenuous exercise like running and aerobics . This could also be more leisurely activities such as walking, housework, yard-work, or taking the stairs . Moderation is the key; a balanced and healthy diet accommodates most foods and drinks . Consider portion sizes and frequency of consumption of certain foods   Meal Ideas & Options:  . Breakfast:  o Whole wheat toast or whole wheat English muffins with peanut butter & hard boiled egg o Steel cut oats topped with apples & cinnamon and skim milk  o Fresh fruit: banana, strawberries, melon, berries, peaches  o Smoothies: strawberries, bananas, greek yogurt, peanut butter o Low fat greek yogurt with blueberries and granola  o Egg white omelet with spinach and mushrooms o Breakfast couscous: whole wheat couscous, apricots, skim milk, cranberries  . Sandwiches:  o Hummus and grilled vegetables (peppers, zucchini, squash) on whole wheat bread   o Grilled chicken on whole wheat pita with lettuce, tomatoes, cucumbers or tzatziki  o Tuna salad on whole wheat bread: tuna salad made with greek yogurt, olives, red peppers, capers, green onions o Garlic rosemary lamb pita: lamb sauted with garlic, rosemary, salt & pepper; add lettuce, cucumber, greek yogurt to pita - flavor with lemon juice and black pepper  . Seafood:  o Mediterranean grilled salmon, seasoned with garlic, basil, parsley, lemon juice and black pepper o Shrimp, lemon, and spinach whole-grain pasta salad made with low fat greek yogurt  o Seared scallops with lemon orzo  o Seared tuna steaks seasoned salt, pepper, coriander topped with tomato mixture of olives, tomatoes, olive oil, minced garlic, parsley, green onions and cappers  . Meats:  o Herbed greek chicken salad with kalamata olives, cucumber, feta  o Red bell peppers stuffed with spinach, bulgur, lean ground beef (or lentils) & topped with feta   o Kebabs: skewers of chicken,  tomatoes, onions, zucchini, squash  o [place]key burgers: made with red onions, mint, dill, lemon juice, feta cheese topped with roasted red peppers . Vegetarian o Cucumber salad: cucumbers, artichoke hearts, celery, red onion, feta cheese, tossed in olive oil & lemon juice  o Hummus and whole grain pita points with a greek salad (lettuce, tomato, feta, olives, cucumbers, red onion) o Lentil soup with celery, carrots made with vegetable broth, garlic, salt and pepper  o Tabouli salad: parsley, bulgur, mint, scallions, cucumbers, tomato, radishes, lemon juice, olive oil, salt and pepper. o

## 2014-06-12 NOTE — Progress Notes (Signed)
Subjective:    Patient ID: Derrick Rouxsaac Capote Jr., male    DOB: 05-21-1971, 43 y.o.   MRN: [id number]  HPI  This 43 y.o. AA male has uncontrolled HTN and obesity. He c/o fatigue due to shift work, poor sleep hygiene and care of young children. 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 He is not able to exercise regularly and has gained weight. He is compliant w/ medications and denies vision disturbances, CP or tightness, palpitations, SOB or DOE, edema, abd or back pain, HA, dizziness, numbness, weakness or syncope.   Pt c/o knee and foot pain (due to working on concrete floors and having flat feet). He states finding good work boots has not alleviated the problem. No hx of acute trauma, joint redness or swelling, fever or myalgias, abnormal rashes or sensations in limbs.   Patient Active Problem List   Diagnosis Date Noted  . Encounter for long-term (current) use of other medications 02/05/2013  . Gout of big toe 02/05/2013  . HTN (hypertension) 01/20/2013  . Obesity, unspecified 01/20/2013    Prior to Admission medications   Medication Sig Start Date End Date Taking? Authorizing Provider  amLODipine (NORVASC) 5 MG tablet Take 1 tablet daily to lower blood pressure.   Yes Maurice MarchBarbara B Toye Rouillard, MD  lisinopril (PRINIVIL,ZESTRIL) 40 MG tablet Take 1 tablet (40 mg total) by mouth daily.   Yes Maurice MarchBarbara B Vitali Seibert, MD   History   Social History  . Marital Status: Married    Spouse Name: N/A    Number of Children: 4  . Years of Education: N/A   Occupational History  . Mechanic [employer]   Social History Main Topics  . Smoking status: Never Smoker   . Smokeless tobacco: Not on file  . Alcohol Use: No  . Drug Use: No  . Sexual Activity: Yes   Other Topics Concern  . Not on file   Social History Narrative   Married with children. Education: [employer].     Review of Systems    Constitutional: Positive for fatigue and unexpected weight change. Negative for fever, diaphoresis and appetite change.  Eyes: Negative for visual disturbance.  Respiratory: Negative for cough, chest tightness and shortness of breath.   Cardiovascular: Negative.   Endocrine: Negative.   Musculoskeletal: Positive for arthralgias. Negative for myalgias, back pain, joint swelling and gait problem.  Skin: Negative.   Neurological: Negative.   Psychiatric/Behavioral: Negative.        Objective:   Physical Exam  Constitutional: He is oriented to person, place, and time. He appears well-developed and well-nourished. No distress.  Weight up 15 lbs  HENT:  Head: Normocephalic and atraumatic.  Right Ear: External ear normal.  Left Ear: External ear normal.  Mouth/Throat: Oropharynx is clear and moist.  Eyes: Conjunctivae and EOM are normal. Pupils are equal, round, and reactive to light. No scleral icterus.  Neck: Normal range of motion. Neck supple.  Cardiovascular: Normal rate, regular rhythm and normal heart sounds.  Exam reveals no gallop and no friction rub.   No murmur heard. Pulmonary/Chest: Effort normal and breath sounds normal. No respiratory distress. He has no rales.  Musculoskeletal: Normal range of motion. He exhibits no tenderness.  Trace pre-tibial edema.  Neurological: He is alert and oriented to person, place, and time. No cranial nerve deficit. Coordination normal.  Skin: Skin is warm and dry. He is  not diaphoretic.  Psychiatric: He has a normal mood and affect. His behavior is normal. Judgment and thought content normal.  Nursing note and vitals reviewed.   Results for orders placed or performed in visit on 06/12/14  POCT glycosylated hemoglobin (Hb A1C)  Result Value Ref Range   Hemoglobin A1C 6.0   POCT glucose (manual entry)  Result Value Ref Range   POC Glucose 106 (A) 70 - 99 mg/dl       Assessment & Plan:  Essential hypertension - Refill Lisinopril 40 mg  1 tablet daily; increase Amlodipine to 10 mg  1 tablet daily. Monitor BP; RX for new wrist monitor. Contact clinic if BP remains elevated. Plan: Basic metabolic panel  Morbid obesity - Impaired glucose tolerance; will encourage Mediterranean Diet and lifestyle. Plan: POCT glycosylated hemoglobin (Hb A1C), POCT glucose (manual entry)  Weight gain - Advised improved lifestyle to promote weight loss. Plan: POCT glycosylated hemoglobin (Hb A1C), POCT glucose (manual entry)  Renal insufficiency, mild - Plan: Basic metabolic panel   Meds ordered this encounter  Medications  . lisinopril (PRINIVIL,ZESTRIL) 40 MG tablet    Sig: Take 1 tablet (40 mg total) by mouth daily.    Dispense:  30 tablet    Refill:  5  . amLODipine (NORVASC) 10 MG tablet    Sig: Take 1 tablet daily to lower blood pressure.    Dispense:  30 tablet    Refill:  5  . Blood Pressure Monitoring (BLOOD PRESSURE MONITOR/WRIST) DEVI    Sig: 1 Device by Does not apply route daily.    Dispense:  1 Device    Refill:  0

## 2014-06-15 NOTE — Progress Notes (Signed)
Quick Note:  Please advise pt regarding following labs...  Kidney function is still abnormal. It is essential that you take medication as prescribed and work on weight loss. Make healthy food choices and avoid sodas and excessive amounts of sugary drinks. Drink plenty of water to help your kidneys function better. Compared to 1 year ago, kidney function is better. Blood sugar is above normal; you are PRE-DIABETIC and are at high risk for developing DIABETES if you do not reduce your weight.  We will be discussing this in April at your CPE appointment. Contact the clinic if you have any questions or concerns before that time.  Copy to pt. ______

## 2014-08-05 ENCOUNTER — Other Ambulatory Visit: Payer: Self-pay

## 2014-08-05 MED ORDER — AMLODIPINE BESYLATE 10 MG PO TABS: ORAL_TABLET | ORAL | Status: DC

## 2014-10-17 ENCOUNTER — Encounter: Payer: BC Managed Care – PPO | Admitting: Family Medicine

## 2014-12-31 ENCOUNTER — Other Ambulatory Visit: Payer: Self-pay | Admitting: Family Medicine

## 2015-01-29 ENCOUNTER — Other Ambulatory Visit: Payer: Self-pay | Admitting: Physician Assistant

## 2015-02-02 ENCOUNTER — Other Ambulatory Visit: Payer: Self-pay | Admitting: Family Medicine

## 2015-03-03 ENCOUNTER — Other Ambulatory Visit: Payer: Self-pay | Admitting: Physician Assistant

## 2015-03-23 ENCOUNTER — Encounter: Payer: Self-pay | Admitting: Family Medicine

## 2015-03-23 ENCOUNTER — Ambulatory Visit (INDEPENDENT_AMBULATORY_CARE_PROVIDER_SITE_OTHER): Payer: BLUE CROSS/BLUE SHIELD | Admitting: Family Medicine

## 2015-03-23 VITALS — BP 155/106 | HR 89 | Temp 98.7°F | Resp 18 | Ht 78.0 in | Wt 341.4 lb

## 2015-03-23 DIAGNOSIS — M25561 Pain in right knee: Secondary | ICD-10-CM | POA: Diagnosis not present

## 2015-03-23 DIAGNOSIS — E669 Obesity, unspecified: Secondary | ICD-10-CM

## 2015-03-23 DIAGNOSIS — N289 Disorder of kidney and ureter, unspecified: Secondary | ICD-10-CM | POA: Diagnosis not present

## 2015-03-23 DIAGNOSIS — I10 Essential (primary) hypertension: Secondary | ICD-10-CM | POA: Diagnosis not present

## 2015-03-23 DIAGNOSIS — R7309 Other abnormal glucose: Secondary | ICD-10-CM

## 2015-03-23 DIAGNOSIS — R7303 Prediabetes: Secondary | ICD-10-CM

## 2015-03-23 LAB — COMPLETE METABOLIC PANEL WITH GFR
ALT: 42 U/L (ref 9–46)
AST: 26 U/L (ref 10–40)
Albumin: 3.7 g/dL (ref 3.6–5.1)
Alkaline Phosphatase: 108 U/L (ref 40–115)
BUN: 13 mg/dL (ref 7–25)
CO2: 31 mmol/L (ref 20–31)
Calcium: 9.7 mg/dL (ref 8.6–10.3)
Chloride: 101 mmol/L (ref 98–110)
Creat: 1.46 mg/dL — ABNORMAL HIGH (ref 0.60–1.35)
GFR, Est African American: 67 mL/min (ref >=60)
GFR, Est Non African American: 58 mL/min — ABNORMAL LOW (ref >=60)
Glucose, Bld: 93 mg/dL (ref 65–99)
Potassium: 4.1 mmol/L (ref 3.5–5.3)
Sodium: 144 mmol/L (ref 135–146)
Total Bilirubin: 0.5 mg/dL (ref 0.2–1.2)
Total Protein: 7.1 g/dL (ref 6.1–8.1)

## 2015-03-23 LAB — POCT GLYCOSYLATED HEMOGLOBIN (HGB A1C): Hemoglobin A1C: 6

## 2015-03-23 LAB — GLUCOSE, POCT (MANUAL RESULT ENTRY): POC Glucose: 96 mg/dl (ref 70–99)

## 2015-03-23 MED ORDER — LISINOPRIL 40 MG PO TABS
ORAL_TABLET | ORAL | Status: DC
Start: 2015-03-23 — End: 2015-10-07

## 2015-03-23 MED ORDER — AMLODIPINE BESYLATE 10 MG PO TABS: ORAL_TABLET | ORAL | Status: DC

## 2015-03-23 NOTE — Patient Instructions (Addendum)
Your blood pressure was very high on initial evaluation today.  I am concerned it may be running higher at home than what your monitor is telling you. Return in the next week to 10 days with your monitor to check blood pressure again here and determine if medication changes needed.  Return to the clinic or go to the nearest emergency room if any of your symptoms worsen or new symptoms occur.  Walking for exercise as tolerated. I will also refer you to nutritionist.  Follow up with me in next week to 10 days for your knee. Ok to use tylenol, ice if needed, elevate if needed. Avoid nsaids like ibuprofen as those can affect your kidneys.   Return to the clinic or go to the nearest emergency room if any of your symptoms worsen or new symptoms occur.
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
## 2015-04-27 ENCOUNTER — Ambulatory Visit: Payer: Self-pay | Admitting: Skilled Nursing Facility1

## 2015-05-03 ENCOUNTER — Other Ambulatory Visit: Payer: Self-pay | Admitting: Physician Assistant

## 2015-08-03 ENCOUNTER — Other Ambulatory Visit: Payer: Self-pay | Admitting: Physician Assistant

## 2015-08-12 ENCOUNTER — Other Ambulatory Visit: Payer: Self-pay | Admitting: Physician Assistant

## 2015-10-07 ENCOUNTER — Other Ambulatory Visit: Payer: Self-pay | Admitting: Family Medicine

## 2015-11-09 ENCOUNTER — Other Ambulatory Visit: Payer: Self-pay | Admitting: Physician Assistant

## 2016-01-09 ENCOUNTER — Other Ambulatory Visit: Payer: Self-pay | Admitting: Family Medicine

## 2016-03-07 ENCOUNTER — Ambulatory Visit (INDEPENDENT_AMBULATORY_CARE_PROVIDER_SITE_OTHER): Payer: BLUE CROSS/BLUE SHIELD | Admitting: Physician Assistant

## 2016-03-07 VITALS — BP 128/84 | HR 88 | Temp 98.1°F | Resp 20 | Ht 78.0 in | Wt 361.4 lb

## 2016-03-07 DIAGNOSIS — I10 Essential (primary) hypertension: Secondary | ICD-10-CM | POA: Diagnosis not present

## 2016-03-07 DIAGNOSIS — R600 Localized edema: Secondary | ICD-10-CM | POA: Diagnosis not present

## 2016-03-07 DIAGNOSIS — R809 Proteinuria, unspecified: Secondary | ICD-10-CM

## 2016-03-07 LAB — POCT URINALYSIS DIP (MANUAL ENTRY)
Bilirubin, UA: NEGATIVE
Glucose, UA: NEGATIVE
Leukocytes, UA: NEGATIVE
Nitrite, UA: NEGATIVE
Protein Ur, POC: >=300 — AB
Spec Grav, UA: >=1.03
Urobilinogen, UA: 1
pH, UA: 5.5

## 2016-03-07 LAB — COMPLETE METABOLIC PANEL WITH GFR
ALT: 40 U/L (ref 9–46)
AST: 28 U/L (ref 10–40)
Albumin: 3.7 g/dL (ref 3.6–5.1)
Alkaline Phosphatase: 94 U/L (ref 40–115)
BUN: 16 mg/dL (ref 7–25)
CO2: 31 mmol/L (ref 20–31)
Calcium: 9.2 mg/dL (ref 8.6–10.3)
Chloride: 105 mmol/L (ref 98–110)
Creat: 1.67 mg/dL — ABNORMAL HIGH (ref 0.60–1.35)
GFR, Est African American: 57 mL/min — ABNORMAL LOW (ref >=60)
GFR, Est Non African American: 49 mL/min — ABNORMAL LOW (ref >=60)
Glucose, Bld: 78 mg/dL (ref 65–99)
Potassium: 4.3 mmol/L (ref 3.5–5.3)
Sodium: 144 mmol/L (ref 135–146)
Total Bilirubin: 0.4 mg/dL (ref 0.2–1.2)
Total Protein: 7 g/dL (ref 6.1–8.1)

## 2016-03-07 LAB — POC MICROSCOPIC URINALYSIS (UMFC): Mucus: ABSENT

## 2016-03-07 MED ORDER — AMLODIPINE BESYLATE 10 MG PO TABS: ORAL_TABLET | ORAL | 2 refills | Status: DC

## 2016-03-07 MED ORDER — LISINOPRIL 40 MG PO TABS: 40.0000 mg | ORAL_TABLET | Freq: Every day | ORAL | 2 refills | Status: DC

## 2016-03-07 NOTE — Patient Instructions (Addendum)
  Come back in 2 weeks (after 03/22/16) and bring blood pressure cuff in so we can calibrate it and for annual physical exam. Continue to elevate legs especially after you spend a lot of time outside in the heat.     IF you received an x-ray today, you will receive an invoice from [facility]. Please contact [facility] at [phone] with questions or concerns regarding your invoice.   IF you received labwork today, you will receive an invoice from [facility]. Please contact [facility] at [phone] with questions or concerns regarding your invoice.   Our billing staff will not be able to assist you with questions regarding bills from these companies.  You will be contacted with the lab results as soon as they are available. The fastest way to get your results is to activate your My Chart account. Instructions are located on the last page of this paperwork. If you have not heard from us regarding the results in 2 weeks, please contact this office.

## 2016-03-07 NOTE — Progress Notes (Signed)
MRN: [id number] DOB: [birth year]  Subjective:   Derrick Rouxsaac Scinto Jr. is a 45 y.o. male presenting for follow up on Hypertension.   Currently managed with amlodipine 10mg  and lisinopril 40mg . Has tried HCTZ in the past but had gout associated with it. Patient is checking blood pressure at home, range is 130-140s systolic. 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 Reports intermittent lower leg swelling for a while, but patient is not bothered by the swelling. Notes that it goes away with leg elevation.   Denies lightheadedness, dizziness, chronic headache, double vision, chest pain, shortness of breath, orthopnea, dyspnea on exertion, heart racing, palpitations, nausea, vomiting, abdominal pain,and  hematuria. Denies smoking and alcohol use. Denies any other aggravating or relieving factors, no other questions or concerns.  Pt knows he needs to work on exercise and diet. He is walking daily at work but does not do structured activity. In terms of diet, he notes he only eats two meals but works third shift and eats his biggest meal right when he gets off work before going to bed. Has been trying to incorporate more fruits and vegetables as snacks. Notes his biggest weakness is soda. Does drink water daily.   Derrick Cain has a current medication list which includes the following prescription(s): amlodipine and lisinopril. Also has No Known Allergies.  Derrick Cain  has a past medical history of Hypertension. Also  has no past surgical history on file.  Objective:   Vitals: BP 128/84   Pulse 88   Temp 98.1 F (36.7 C) (Oral)   Resp 20   Ht 6\' 6"  (1.981 m)   Wt (!) 361 lb 6.4 oz (163.9 kg)   SpO2 97%   BMI 41.76 kg/m   Physical Exam  Constitutional: He is oriented to person, place, and time. He appears well-developed and well-nourished.  HENT:  Head: Normocephalic and atraumatic.  Eyes: Conjunctivae are normal.  Neck: Normal range of motion.  Cardiovascular: Normal rate, regular rhythm,  normal heart sounds and intact distal pulses.   Pulmonary/Chest: Effort normal.  Musculoskeletal: Normal range of motion.       Right lower leg: He exhibits edema ( 1+ pitting edema to mid shin).       Left lower leg: He exhibits edema ( 1+ pitting edema to mid shin).  Neurological: He is alert and oriented to person, place, and time.  Skin: Skin is warm and dry.  Psychiatric: He has a normal mood and affect.  Vitals reviewed.  Results for orders placed or performed in visit on 03/07/16 (from the past 24 hour(s))  POCT urinalysis dipstick     Status: Abnormal   Collection Time: 03/07/16  2:37 PM  Result Value Ref Range   Color, UA yellow yellow   Clarity, UA clear clear   Glucose, UA negative negative   Bilirubin, UA negative negative   Ketones, POC UA trace (5) (A) negative   Spec Grav, UA >=1.030    Blood, UA trace-intact (A) negative   pH, UA 5.5    Protein Ur, POC >=300 (A) negative   Urobilinogen, UA 1.0    Nitrite, UA Negative Negative   Leukocytes, UA Negative Negative  POCT Microscopic Urinalysis ([facility])     Status: None   Collection Time: 03/07/16  2:45 PM  Result Value Ref Range   WBC,UR,HPF,POC None None WBC/hpf   RBC,UR,HPF,POC None None RBC/hpf   Bacteria None None, Too numerous to count   Mucus Absent Absent  Epithelial Cells, UR Per Microscopy None None, Too numerous to count cells/hpf    Assessment and Plan :  1. Essential hypertension -Follow up in 2 weeks for annual physical exam and labs  -Bring in bp cuff for calibration - COMPLETE METABOLIC PANEL WITH GFR - POCT urinalysis dipstick - POCT Microscopic Urinalysis ([facility]) - lisinopril (PRINIVIL,ZESTRIL) 40 MG tablet; Take 1 tablet (40 mg total) by mouth daily.  Dispense: 90 tablet; Refill: 2 - amLODipine (NORVASC) 10 MG tablet; Take 1 tablet daily  Dispense: 90 tablet; Refill: 2 -May consider nephrology referral depending on CMP result and if bp not controlled at next visit  2. Lower leg  edema -Continue to monitor -Elevate legs when edema is present    Benjiman Core, PA-C  [facility] 03/07/2016 2:50 PM

## 2016-03-10 NOTE — Addendum Note (Signed)
Addended by: Benjiman CoreWISEMAN, Demetreus Lothamer D on: 03/10/2016 12:03 PM   Modules accepted: Orders

## 2016-03-16 ENCOUNTER — Telehealth: Payer: Self-pay | Admitting: Emergency Medicine

## 2016-03-16 NOTE — Telephone Encounter (Signed)
-----   Message from Magdalene RiverBrittany D Wiseman, [place]-C sent at 03/10/2016  1:47 PM EDT ----- Please call patient and let him know that his creatinine is a little elevated and his GFR, which looks at his kidney function has decreased since the last CMP 11 months ago. Due to these results I am sending in a referral for nephrology so if he receives an appointment from a nephrology office do not be alarmed. Please have him follow up with me as discussed at his last visit. Thank you!

## 2016-04-11 ENCOUNTER — Ambulatory Visit (INDEPENDENT_AMBULATORY_CARE_PROVIDER_SITE_OTHER): Payer: BLUE CROSS/BLUE SHIELD | Admitting: Family Medicine

## 2016-04-11 VITALS — BP 138/84 | HR 88 | Temp 98.1°F | Resp 18 | Ht 78.0 in | Wt 353.2 lb

## 2016-04-11 DIAGNOSIS — Z8739 Personal history of other diseases of the musculoskeletal system and connective tissue: Secondary | ICD-10-CM | POA: Diagnosis not present

## 2016-04-11 DIAGNOSIS — R351 Nocturia: Secondary | ICD-10-CM | POA: Insufficient documentation

## 2016-04-11 DIAGNOSIS — M25562 Pain in left knee: Secondary | ICD-10-CM | POA: Insufficient documentation

## 2016-04-11 DIAGNOSIS — R7303 Prediabetes: Secondary | ICD-10-CM | POA: Diagnosis not present

## 2016-04-11 DIAGNOSIS — I10 Essential (primary) hypertension: Secondary | ICD-10-CM

## 2016-04-11 DIAGNOSIS — Z114 Encounter for screening for human immunodeficiency virus [HIV]: Secondary | ICD-10-CM

## 2016-04-11 LAB — LIPID PANEL
Cholesterol: 172 mg/dL (ref 125–200)
HDL: 34 mg/dL — ABNORMAL LOW (ref >=40)
LDL Cholesterol: 112 mg/dL (ref <130)
Total CHOL/HDL Ratio: 5.1 Ratio — ABNORMAL HIGH (ref <=5.0)
Triglycerides: 132 mg/dL (ref <150)
VLDL: 26 mg/dL (ref <30)

## 2016-04-11 LAB — URIC ACID: Uric Acid, Serum: 7.9 mg/dL (ref 4.0–8.0)

## 2016-04-11 LAB — PSA: PSA: 0.1 ng/mL (ref <=4.0)

## 2016-04-11 LAB — HIV ANTIBODY (ROUTINE TESTING W REFLEX): HIV 1&2 Ab, 4th Generation: NONREACTIVE

## 2016-04-11 MED ORDER — MELOXICAM 15 MG PO TABS: 15.0000 mg | ORAL_TABLET | Freq: Every day | ORAL | 0 refills | Status: DC

## 2016-04-11 NOTE — Patient Instructions (Addendum)
Thank you for coming in,   We will call you with the results from today.    Please feel free to call with any questions or concerns at any time, at [phone]. --Dr. Jordan LikesSchmitz  Diet Recommendations for Hypertension    Starchy (carb) foods include: Bread, rice, pasta, potatoes, corn, crackers, bagels, muffins, all baked goods.   Protein foods include: Meat, fish, poultry, eggs, dairy foods, and beans such as pinto and kidney beans (beans also provide carbohydrate).   1. Eat at least 3 meals and 1-2 snacks per day. Never go more than 4-5 hours while awake without eating.  2. Limit starchy foods to TWO per meal and ONE per snack. ONE portion of a starchy  food is equal to the following:   - ONE slice of bread (or its equivalent, such as half of a hamburger bun).   - 1/2 cup of a "scoopable" starchy food such as potatoes or rice.   - 1 OUNCE (28 grams) of starchy snack foods such as crackers or pretzels (look on label).   - 15 grams of carbohydrate as shown on food label.  3. Both lunch and dinner should include a protein food, a carb food, and vegetables.   - Obtain twice as many veg's as protein or carbohydrate foods for both lunch and dinner.   - Try to keep frozen veg's on hand for a quick vegetable serving.     - Fresh or frozen veg's are best.  4. Breakfast should always include protein.     IF you received an x-ray today, you will receive an invoice from [facility]. Please contact [facility] at [phone] with questions or concerns regarding your invoice.   IF you received labwork today, you will receive an invoice from [facility]. Please contact [facility] at [phone] with questions or concerns regarding your invoice.   Our billing staff will not be able to assist you with questions regarding bills from these companies.  You will be contacted with the lab results as soon as they are available. The fastest way to get your results  is to activate your My Chart account. Instructions are located on the last page of this paperwork. If you have not heard from us regarding the results in 2 weeks, please contact this office.

## 2016-04-11 NOTE — Assessment & Plan Note (Signed)
He understands he needs to lose weight. - provided counseling and information

## 2016-04-11 NOTE — Assessment & Plan Note (Signed)
He has a history of gout related to HCTZ use. His last uric acid was still 7.7  - repeat uric acid today  - may need to consider medication if still elevated.

## 2016-04-11 NOTE — Assessment & Plan Note (Signed)
Control - continue amlodipine

## 2016-04-11 NOTE — Progress Notes (Signed)
Subjective:    Patient ID: Derrick Cain., male    DOB: July 18, 1970, 45 y.o.   MRN: [id number]  Chief Complaint  Patient presents with  . Annual Exam    CPE    PCP: Magdalene River, PA-C  HPI  This is a 45 y.o. male who is presenting with left knee pain and for physical. The knee has been throbbing and sharp pain on the superior aspect of the knee cap. Worse with walking. This started this past Saturday. The pain hasn't been like this before. Denies any specific injury. Denies any swelling. The pain in his knee is rated 10/10. The pain is occurring all day.   History of gout related to HCTZ and has stopped taking it. He is having nocturia which seems more often in the last year and has been prescribed flomax.  He hasn't started taking flomax yet.    Cardiovascular: - Dx Hypertension: yes  - Dx Hyperlipidemia: no  - Dx Obesity: yes  - Physical Activity: yes, related to job but also having knee pain   - Diabetes: prediabetes    Cancer: Colorectal >> Colonoscopy: no  Lung >> Tobacco Use: no  Prostate >> Interested in DRE and/or PSA: yes  Skin >> Suspicious lesions: no   Social: Alcohol Use: no  Tobacco Use: no  Other Drugs: no  Risky Sexual Behavior: no  Depression: no  Support and Life at Home: no   Other: Flu Vaccine: no  Pneumonia Vaccine: no   Review of Systems  ROS: No unexpected weight loss, fever, chills, swelling, instability, muscle pain, numbness/tingling, redness, otherwise see HPI   PMH: Gout, HTN, obesity  PShx: none  PSx: works as a Curator.  FHx: HTN    Objective:   Physical Exam BP 138/84   Pulse 88   Temp 98.1 F (36.7 C) (Oral)   Resp 18   Ht 6\' 6"  (1.981 m)   Wt (!) 353 lb 3.2 oz (160.2 kg)   SpO2 98%   BMI 40.82 kg/m  Gen: NAD, alert, cooperative with exam, well-appearing HEENT: NCAT, EOMI, clear conjunctiva,  CV: RRR, good S1/S2, no murmur, no edema, capillary refill brisk  Resp: CTABL, no wheezes, non-labored Abd: SNTND,  BS present, no guarding or organomegaly Skin: no rashes, normal turgor  Neuro: no gross deficits.  Psych:  alert and oriented GU: normal external anatomy, normal sphincter tone, smooth prostate Left Knee: Normal to inspection with no erythema or effusion or obvious bony abnormalities. Palpation normal with no warmth, joint line tenderness, or condyle tenderness. TTP of the inferior pole of the patella.  ROM full in flexion and extension and lower leg rotation. Ligaments with solid consistent endpoints including LCL, MCL. Negative Mcmurray's Non painful patellar compression. Patellar glide without crepitus. Patellar and quadriceps tendons unremarkable. Hamstring and quadriceps strength is normal.         Assessment & Plan:   History of gout He has a history of gout related to HCTZ use. His last uric acid was still 7.7  - repeat uric acid today  - may need to consider medication if still elevated.   Obesity, Class III, BMI 40-49.9 (morbid obesity) (HCC) He understands he needs to lose weight. - provided counseling and information   Prediabetes Last A1c was 6.0  - Hgb A1c today   HTN (hypertension) Control - continue amlodipine   Nocturia Getting up at night and thought to be related to his previous Anti-hypertensive. Has seen Dr. Hyman Hopes (Renal) and  everything was normal. Checked his prostate today and PSA. Dr. Hyman HopesWebb has started flomax but he hasn't started it yet.  He will follow up to determine if still having any problems once using flomax.   Left knee pain Pain is associated with movement of the patella which would suggest PF syndrome vs arthritic changes.  - sent in mobic and provided exercises  - will f/u and if having problems he will f/u. Would consider x-rays at follow up

## 2016-04-11 NOTE — Assessment & Plan Note (Signed)
Getting up at night and thought to be related to his previous Anti-hypertensive. Has seen Dr. Hyman HopesWebb (Renal) and everything was normal. Checked his prostate today and PSA. Dr. Hyman HopesWebb has started flomax but he hasn't started it yet.  He will follow up to determine if still having any problems once using flomax.

## 2016-04-11 NOTE — Assessment & Plan Note (Signed)
Last A1c was 6.0  - Hgb A1c today

## 2016-04-11 NOTE — Assessment & Plan Note (Signed)
Pain is associated with movement of the patella which would suggest PF syndrome vs arthritic changes.  - sent in mobic and provided exercises  - will f/u and if having problems he will f/u. Would consider x-rays at follow up

## 2016-04-12 LAB — HEMOGLOBIN A1C
Hgb A1c MFr Bld: 5.8 % — ABNORMAL HIGH (ref <5.7)
Mean Plasma Glucose: 120 mg/dL

## 2016-04-13 NOTE — Progress Notes (Signed)
Note reviewed, and agree with documentation and plan.  Signed,   Meredith StaggersJeffrey Pepper Wyndham, MD [facility].  04/13/16 11:40 AM

## 2016-04-26 ENCOUNTER — Telehealth: Payer: Self-pay | Admitting: Family Medicine

## 2016-04-26 NOTE — Telephone Encounter (Signed)
Left VM for patient. If he calls back please have him speak with a nurse/CMA and inform that his A1c has improved which is a good thing. He cholesterol suggests he may benefit from a statin which we can discuss at a future date.   If any questions then please take the best time and phone number to call and I will try to call him back.   Myra RudeJeremy E Rondia Higginbotham, MD PGY-4, [facility] 04/26/2016, 2:25 PM

## 2016-05-08 ENCOUNTER — Other Ambulatory Visit: Payer: Self-pay | Admitting: Family Medicine

## 2016-07-28 ENCOUNTER — Encounter: Payer: Self-pay | Admitting: Family Medicine

## 2016-12-11 ENCOUNTER — Other Ambulatory Visit: Payer: Self-pay | Admitting: Physician Assistant

## 2016-12-11 DIAGNOSIS — I10 Essential (primary) hypertension: Secondary | ICD-10-CM

## 2017-01-10 ENCOUNTER — Other Ambulatory Visit: Payer: Self-pay | Admitting: Physician Assistant

## 2017-01-10 DIAGNOSIS — I10 Essential (primary) hypertension: Secondary | ICD-10-CM

## 2017-01-16 ENCOUNTER — Encounter: Payer: Self-pay | Admitting: Physician Assistant

## 2017-01-16 ENCOUNTER — Ambulatory Visit (INDEPENDENT_AMBULATORY_CARE_PROVIDER_SITE_OTHER): Payer: BLUE CROSS/BLUE SHIELD | Admitting: Physician Assistant

## 2017-01-16 VITALS — BP 157/83 | HR 82 | Temp 97.4°F | Resp 18 | Ht 77.64 in | Wt 364.0 lb

## 2017-01-16 DIAGNOSIS — I10 Essential (primary) hypertension: Secondary | ICD-10-CM

## 2017-01-16 MED ORDER — AMLODIPINE BESYLATE 10 MG PO TABS: 10.0000 mg | ORAL_TABLET | Freq: Every day | ORAL | 1 refills | Status: DC

## 2017-01-16 MED ORDER — LISINOPRIL 40 MG PO TABS: 40.0000 mg | ORAL_TABLET | Freq: Every day | ORAL | 1 refills | Status: DC

## 2017-01-16 NOTE — Progress Notes (Signed)
    MRN: [id number] DOB: 10/07/1970  Subjective:   Derrick Joswick. is a 46 y.o. male presenting for follow up on Hypertension.   Currently managed with amlodipine 9mand lisinopril 446m Has tried HCTZ in the past but had gout associated with it. Patient is checking blood pressure at home, [place] [address]-591Mystolic. Reports no symptoms. Denies lightheadedness, dizziness, chronic headache, double vision, chest pain, shortness of breath, heart racing, palpitations, nausea, vomiting, abdominal pain, hematuria, worsening lower leg swelling. Denies smoking and alcohol. Has not been exercising. Does not add salt to food. Denies eating fast food.   Pt was given nephrology referral at 02/2016 visit because his creatinie was elevated at 1.67. Pt did see Dr. WeJustin MendStates that Dr. WeJustin Mendaid he was fine and told him to follow up. He has to call to schedule this.   IsDetravionas a current medication list which includes the following prescription(s): amlodipine, lisinopril, meloxicam, and tamsulosin. Also has No Known Allergies.  IsLorancehas a past medical history of Hypertension. Also  has no past surgical history on file.   Objective:   Vitals: BP (!) 157/83 (BP Location: Right Arm, Patient Position: Sitting, Cuff Size: Large)   Pulse 82   Temp (!) 97.4 F (36.3 C) (Oral)   Resp 18   Ht 6' 5.64" (1.972 m)   Wt (!) 364 lb (165.1 kg)   SpO2 97%   BMI 42.46 kg/m   Physical Exam  Constitutional: He is oriented to person, place, and time. He appears well-developed and well-nourished.  HENT:  Head: Normocephalic and atraumatic.  Eyes: Conjunctivae are normal.  Neck: Normal range of motion.  Cardiovascular: Normal rate, regular rhythm, normal heart sounds and intact distal pulses.   Pulmonary/Chest: Effort normal and breath sounds normal.  Musculoskeletal:       Right lower leg: He exhibits swelling (2+ pitting edema  ).       Left lower leg: He exhibits swelling (2+ pitting edema  ).    Neurological: He is alert and oriented to person, place, and time.  Skin: Skin is warm and dry.  Psychiatric: He has a normal mood and affect.  Vitals reviewed.     BP Readings from Last 3 Encounters:  01/16/17 (!) 157/83  04/11/16 138/84  03/07/16 128/84   No results found for this or any previous visit (from the past 24 hour(s)).  Assessment and Plan :  1. Essential hypertension Uncontrolled in office. Asymptomatic. Instructed to check bp outside of office at least three times weekly over the next couple of weeks. Return if consistently >140/90 as he may warrant a 3rd bp agent if still elevated at that time. Otherwise, return in 6 months for reevaluation. Given strict ED precautions.  - CMP14+EGFR - lisinopril (PRINIVIL,ZESTRIL) 40 MG tablet; Take 1 tablet (40 mg total) by mouth daily.  Dispense: 90 tablet; Refill: 1 - amLODipine (NORVASC) 10 MG tablet; Take 1 tablet (10 mg total) by mouth daily.  Dispense: 90 tablet; Refill: 1 [place]  [facility] [place]/[facility]/2017 4:51 PM

## 2017-01-16 NOTE — Patient Instructions (Addendum)
In terms of elevated blood pressure, I would like you to check your blood pressure at least a couple times over the next week outside of the office and document these values. It is best if you check the blood pressure at different times in the day. Your goal is <140/90. If your values are consistently above this goal, please return to office for further evaluation within the next two weeks. If they stay below this, follow up in 6 months. At your follow up appointment, bring your home blood pressure cuff to the office so we can compare it to ours. If you start to have chest pain, blurred vision, shortness of breath, severe headache, lower leg swelling, or nausea/vomiting please seek care immediately here or at the ED.     IF you received an x-ray today, you will receive an invoice from [facility]. Please contact [facility] at [phone] with questions or concerns regarding your invoice.   IF you received labwork today, you will receive an invoice from [facility]. Please contact [facility] at [phone] with questions or concerns regarding your invoice.   Our billing staff will not be able to assist you with questions regarding bills from these companies.  You will be contacted with the lab results as soon as they are available. The fastest way to get your results is to activate your My Chart account. Instructions are located on the last page of this paperwork. If you have not heard from us regarding the results in 2 weeks, please contact this office.

## 2017-01-17 LAB — CMP14+EGFR
ALT: 81 IU/L — ABNORMAL HIGH (ref 0–44)
AST: 47 IU/L — ABNORMAL HIGH (ref 0–40)
Albumin/Globulin Ratio: 1.4 (ref 1.2–2.2)
Albumin: 4.2 g/dL (ref 3.5–5.5)
Alkaline Phosphatase: 118 IU/L — ABNORMAL HIGH (ref 39–117)
BUN/Creatinine Ratio: 7 — ABNORMAL LOW (ref 9–20)
BUN: 12 mg/dL (ref 6–24)
Bilirubin Total: 0.3 mg/dL (ref 0.0–1.2)
CO2: 27 mmol/L (ref 20–29)
Calcium: 9.7 mg/dL (ref 8.7–10.2)
Chloride: 101 mmol/L (ref 96–106)
Creatinine, Ser: 1.65 mg/dL — ABNORMAL HIGH (ref 0.76–1.27)
GFR calc Af Amer: 57 mL/min/{1.73_m2} — ABNORMAL LOW (ref >59)
GFR calc non Af Amer: 49 mL/min/{1.73_m2} — ABNORMAL LOW (ref >59)
Globulin, Total: 3 g/dL (ref 1.5–4.5)
Glucose: 105 mg/dL — ABNORMAL HIGH (ref 65–99)
Potassium: 4.6 mmol/L (ref 3.5–5.2)
Sodium: 146 mmol/L — ABNORMAL HIGH (ref 134–144)
Total Protein: 7.2 g/dL (ref 6.0–8.5)

## 2017-02-22 ENCOUNTER — Ambulatory Visit (INDEPENDENT_AMBULATORY_CARE_PROVIDER_SITE_OTHER): Payer: BLUE CROSS/BLUE SHIELD | Admitting: Physician Assistant

## 2017-02-22 ENCOUNTER — Encounter: Payer: Self-pay | Admitting: Physician Assistant

## 2017-02-22 VITALS — BP 142/82 | HR 95 | Temp 98.0°F | Resp 18 | Ht 77.0 in | Wt 365.0 lb

## 2017-02-22 DIAGNOSIS — R7989 Other specified abnormal findings of blood chemistry: Secondary | ICD-10-CM

## 2017-02-22 DIAGNOSIS — I4510 Unspecified right bundle-branch block: Secondary | ICD-10-CM | POA: Diagnosis not present

## 2017-02-22 DIAGNOSIS — E669 Obesity, unspecified: Secondary | ICD-10-CM | POA: Diagnosis not present

## 2017-02-22 DIAGNOSIS — R0683 Snoring: Secondary | ICD-10-CM

## 2017-02-22 DIAGNOSIS — Z6841 Body Mass Index (BMI) 40.0 and over, adult: Secondary | ICD-10-CM | POA: Diagnosis not present

## 2017-02-22 DIAGNOSIS — I10 Essential (primary) hypertension: Secondary | ICD-10-CM

## 2017-02-22 DIAGNOSIS — IMO0001 Reserved for inherently not codable concepts without codable children: Secondary | ICD-10-CM

## 2017-02-22 MED ORDER — METOPROLOL SUCCINATE ER 25 MG PO TB24: 25.0000 mg | ORAL_TABLET | Freq: Every day | ORAL | 0 refills | Status: DC

## 2017-02-22 NOTE — Patient Instructions (Addendum)
Continue taking amlodipine 10mg  and lisinopril 40mg  daily. Start taking your new medication, metoprolol 25mg  daily. Take this dose for one week and monitor your blood pressure and heart rate. If your blood pressure remains >140/90 and your heart rate is >70, you can increase your metoprolol to 50mg  daily. If your bp is remaining <140/90, stay at 25mg  dose. Common side effects of metoprolol are fatigue, dizziness, and decreased heart rate. If you ever decide to stop this medication, please taper off of it over 1-2 weeks. Please follow up in office in 4-6 weeks. If you start to have chest pain, blurred vision, shortness of breath, severe headache, lower leg swelling, or nausea/vomiting please seek care immediately here or at the ED.   DASH Eating Plan DASH stands for "Dietary Approaches to Stop Hypertension." The DASH eating plan is a healthy eating plan that has been shown to reduce high blood pressure (hypertension). It may also reduce your risk for type 2 diabetes, heart disease, and stroke. The DASH eating plan may also help with weight loss. What are tips for following this plan? General guidelines  Avoid eating more than 2,300 mg (milligrams) of salt (sodium) a day. If you have hypertension, you may need to reduce your sodium intake to 1,500 mg a day.  Limit alcohol intake to no more than 1 drink a day for nonpregnant women and 2 drinks a day for men. One drink equals 12 oz of beer, 5 oz of wine, or 1 oz of hard liquor.  Work with your health care provider to maintain a healthy body weight or to lose weight. Ask what an ideal weight is for you.  Get at least 30 minutes of exercise that causes your heart to beat faster (aerobic exercise) most days of the week. Activities may include walking, swimming, or biking.  Work with your health care provider or diet and nutrition specialist (dietitian) to adjust your eating plan to your individual calorie needs. Reading food labels  Check food labels for  the amount of sodium per serving. Choose foods with less than 5 percent of the Daily Value of sodium. Generally, foods with less than 300 mg of sodium per serving fit into this eating plan.  To find whole grains, look for the word "whole" as the first word in the ingredient list. Shopping  Buy products labeled as "low-sodium" or "no salt added."  Buy fresh foods. Avoid canned foods and premade or frozen meals. Cooking  Avoid adding salt when cooking. Use salt-free seasonings or herbs instead of table salt or sea salt. Check with your health care provider or pharmacist before using salt substitutes.  Do not fry foods. Cook foods using healthy methods such as baking, boiling, grilling, and broiling instead.  Cook with heart-healthy oils, such as olive, canola, soybean, or sunflower oil. Meal planning   Eat a balanced diet that includes: ? 5 or more servings of fruits and vegetables each day. At each meal, try to fill half of your plate with fruits and vegetables. ? Up to 6-8 servings of whole grains each day. ? Less than 6 oz of lean meat, poultry, or fish each day. A 3-oz serving of meat is about the same size as a deck of cards. One egg equals 1 oz. ? 2 servings of low-fat dairy each day. ? A serving of nuts, seeds, or beans 5 times each week. ? Heart-healthy fats. Healthy fats called Omega-3 fatty acids are found in foods such as flaxseeds and coldwater fish, like  sardines, salmon, and mackerel.  Limit how much you eat of the following: ? Canned or prepackaged foods. ? Food that is high in trans fat, such as fried foods. ? Food that is high in saturated fat, such as fatty meat. ? Sweets, desserts, sugary drinks, and other foods with added sugar. ? Full-fat dairy products.  Do not salt foods before eating.  Try to eat at least 2 vegetarian meals each week.  Eat more home-cooked food and less restaurant, buffet, and fast food.  When eating at a restaurant, ask that your food be  prepared with less salt or no salt, if possible. What foods are recommended? The items listed may not be a complete list. Talk with your dietitian about what dietary choices are best for you. Grains Whole-grain or whole-wheat bread. Whole-grain or whole-wheat pasta. Brown rice. [place]row. Bulgur. Whole-grain and low-sodium cereals. Pita bread. Low-fat, low-sodium crackers. Whole-wheat flour tortillas. Vegetables Fresh or frozen vegetables (raw, steamed, roasted, or grilled). Low-sodium or reduced-sodium tomato and vegetable juice. Low-sodium or reduced-sodium tomato sauce and tomato paste. Low-sodium or reduced-sodium canned vegetables. Fruits All fresh, dried, or frozen fruit. Canned fruit in natural juice (without added sugar). Meat and other protein foods Skinless chicken or [place]. Ground chicken or [place]. Pork with fat trimmed off. Fish and seafood. Egg whites. Dried beans, peas, or lentils. Unsalted nuts, nut butters, and seeds. Unsalted canned beans. Lean cuts of beef with fat trimmed off. Low-sodium, lean deli meat. Dairy Low-fat (1%) or fat-free (skim) milk. Fat-free, low-fat, or reduced-fat cheeses. Nonfat, low-sodium ricotta or cottage cheese. Low-fat or nonfat yogurt. Low-fat, low-sodium cheese. Fats and oils Soft margarine without trans fats. Vegetable oil. Low-fat, reduced-fat, or light mayonnaise and salad dressings (reduced-sodium). Canola, safflower, olive, soybean, and sunflower oils. Avocado. Seasoning and other foods Herbs. Spices. Seasoning mixes without salt. Unsalted popcorn and pretzels. Fat-free sweets. What foods are not recommended? The items listed may not be a complete list. Talk with your dietitian about what dietary choices are best for you. Grains Baked goods made with fat, such as croissants, muffins, or some breads. Dry pasta or rice meal packs. Vegetables Creamed or fried vegetables. Vegetables in a cheese sauce. Regular canned vegetables (not  low-sodium or reduced-sodium). Regular canned tomato sauce and paste (not low-sodium or reduced-sodium). Regular tomato and vegetable juice (not low-sodium or reduced-sodium). Angie Fava. Olives. Fruits Canned fruit in a light or heavy syrup. Fried fruit. Fruit in cream or butter sauce. Meat and other protein foods Fatty cuts of meat. Ribs. Fried meat. Berniece Salines. Sausage. Bologna and other processed lunch meats. Salami. Fatback. Hotdogs. Bratwurst. Salted nuts and seeds. Canned beans with added salt. Canned or smoked fish. Whole eggs or egg yolks. Chicken or [place] with skin. Dairy Whole or 2% milk, cream, and half-and-half. Whole or full-fat cream cheese. Whole-fat or sweetened yogurt. Full-fat cheese. Nondairy creamers. Whipped toppings. Processed cheese and cheese spreads. Fats and oils Butter. Stick margarine. Lard. Shortening. Ghee. Bacon fat. Tropical oils, such as coconut, palm kernel, or palm oil. Seasoning and other foods Salted popcorn and pretzels. Onion salt, garlic salt, seasoned salt, table salt, and sea salt. Worcestershire sauce. Tartar sauce. Barbecue sauce. Teriyaki sauce. Soy sauce, including reduced-sodium. Steak sauce. Canned and packaged gravies. Fish sauce. Oyster sauce. Cocktail sauce. Horseradish that you find on the shelf. Ketchup. Mustard. Meat flavorings and tenderizers. Bouillon cubes. Hot sauce and Tabasco sauce. Premade or packaged marinades. Premade or packaged taco seasonings. Relishes. Regular salad dressings. Where to find more information:  National Heart, Lung, and Blood Institute: PopSteam.is  American Heart Association: www.heart.org Summary  The DASH eating plan is a healthy eating plan that has been shown to reduce high blood pressure (hypertension). It may also reduce your risk for type 2 diabetes, heart disease, and stroke.  With the DASH eating plan, you should limit salt (sodium) intake to 2,300 mg a day. If you have hypertension, you may need to reduce  your sodium intake to 1,500 mg a day.  When on the DASH eating plan, aim to eat more fresh fruits and vegetables, whole grains, lean proteins, low-fat dairy, and heart-healthy fats.  Work with your health care provider or diet and nutrition specialist (dietitian) to adjust your eating plan to your individual calorie needs. This information is not intended to replace advice given to you by your health care provider. Make sure you discuss any questions you have with your health care provider. Document Released: 06/16/2011 Document Revised: 06/20/2016 Document Reviewed: 06/20/2016 Elsevier Interactive Patient Education  2017 [employer].   IF you received an x-ray today, you will receive an invoice from [facility]. Please contact [facility] at [phone] with questions or concerns regarding your invoice.   IF you received labwork today, you will receive an invoice from [facility]. Please contact [facility] at [phone] with questions or concerns regarding your invoice.   Our billing staff will not be able to assist you with questions regarding bills from these companies.  You will be contacted with the lab results as soon as they are available. The fastest way to get your results is to activate your My Chart account. Instructions are located on the last page of this paperwork. If you have not heard from [place] regarding the results in 2 weeks, please contact this office.

## 2017-02-22 NOTE — Progress Notes (Signed)
MRN: [id number] DOB: 10-01-1970  Subjective:   Derrick Cain. is a 45 y.o. male presenting for follow up on Hypertension. Pt last seen in office on 01/16/17 for med refills for HTN. His bp was elevated at 157/83. Instructed to check bp outside office and return in 4 weeks if home readings were consistently >140/90 despite being on medications. He is urrently managed with amlodipine 82m and lisinopril 466m Has tried HCTZ in the past but he had frequent gout flares while on the medication, which resolved once the medication was discontinued.  Patient is checking blood pressure at [place], [place] [address]Kystolic. He uses a wrist monitor to check bp. Denies lightheadedness, dizziness, chronic headache, double vision, chest pain, shortness of breath, heart racing, palpitations, nausea, vomiting, abdominal pain, hematuria, worsening lower leg swelling. Pt's diet consist of chicken, tu[place]and vegetables. Does not add salt to food. Avoids frozen meals and high salty snacks. Does eat salty peanuts occasionally. He is not currently exercising but is about to start walking with his wife.  Denies smoking and alcohol use. Pt does admit to snoring during the night, waking up gasping for air, and daytime fatigue. Has FH of HTN in mother and father. Has FH of OSA in father. Of note, pt has been evaluated by cardiology in the past, has had stress test, which was normal.   In terms of elevated creatinine, he is calling DerrickWebb with nephrology, today to schedule follow up appointment.   IsZachariusas a current medication list which includes the following prescription(s): amlodipine and lisinopril. Also has No Known Allergies.  IsUrhohas a past medical history of Hypertension. Also  has no past surgical history on file.   Objective:   Vitals: BP (!) 142/82   Pulse 95   Temp 98 F (36.7 C) (Oral)   Resp 18   Ht _0  (1.956 m)   Wt (!) 365 lb (165.6 kg)   SpO2 94%   BMI 43.28 kg/m   Physical Exam    Constitutional: He is oriented to person, place, and time. He appears well-developed and well-nourished.  HENT:  Head: Normocephalic and atraumatic.  Eyes: Conjunctivae are normal.  Neck: Normal range of motion.  Neck circumference ~46cm  Cardiovascular: Normal rate, regular rhythm, normal heart sounds and intact distal pulses.   Pulmonary/Chest: Effort normal and breath sounds normal.  Musculoskeletal:       Right lower leg: He exhibits edema (2+).       Left lower leg: He exhibits edema (2+).  Neurological: He is alert and oriented to person, place, and time.  Skin: Skin is warm and dry.  Psychiatric: He has a normal mood and affect.  Vitals reviewed.   No results found for this or any previous visit (from the past 24 hour(s)).  EKG shows sinus rhythm with rate of 84bpm. PR interval of 132 msec, QRS interval of 96 msec. Incomplete RBBB noted, which was no present on prior EKG in 09/2013. No acute ST or T wave changes noted. Findings presented and discussed with Dr. SmTamala Julian  Assessment and Plan :  1. Essential hypertension Uncontrolled in office. Our office readings were consistent with home bp monitor readings today in office. Discussed lifestyle modifications including diet and exercise to help gain better control of HTN.  Will add 3rd agent to bp regimen at this time. Plan to start metoprolol 25 mg daily. Instructed to take for one week and monitor bp and HR. If bp remains >  140/90 and HR >70 bpm, pt can increase to metoprolol 49m daily. Do not exceed this dose without further evaluation. I have also placed a neurology referral for sleep study to evaluate if pt has untreated OSA as he has multiple risk factors for OSA. Plan for follow up in office in 4-6 weeks for bp reevaluation.  - EKG 12-Lead - metoprolol succinate (TOPROL-XL) 25 MG 24 hr tablet; Take 1 tablet (25 mg total) by mouth daily.  Dispense: 90 tablet; Refill: 0 - Ambulatory referral to Neurology 2. Snoring 3. Class 3  obesity with body mass index (BMI) of 40.0 to 44.9 in adult, unspecified obesity type, unspecified whether serious comorbidity present ([facility] - Ambulatory referral to Neurology  4. Incomplete RBBB -New finding on EKG. Pt is asymptomatic. Will monitor.   5. Elevated serum creatinine Pt encouraged to contact Dr. WJason Nestoffice today to schedule follow up appointment.  - [place] PA-C  [facility] 02/22/2017 11:46 AM

## 2017-02-23 LAB — CMP14+EGFR
ALT: 38 IU/L (ref 0–44)
AST: 27 IU/L (ref 0–40)
Albumin/Globulin Ratio: 1.1 — ABNORMAL LOW (ref 1.2–2.2)
Albumin: 3.8 g/dL (ref 3.5–5.5)
Alkaline Phosphatase: 101 IU/L (ref 39–117)
BUN/Creatinine Ratio: 9 (ref 9–20)
BUN: 15 mg/dL (ref 6–24)
Bilirubin Total: 0.6 mg/dL (ref 0.0–1.2)
CO2: 28 mmol/L (ref 20–29)
Calcium: 9.4 mg/dL (ref 8.7–10.2)
Chloride: 101 mmol/L (ref 96–106)
Creatinine, Ser: 1.68 mg/dL — ABNORMAL HIGH (ref 0.76–1.27)
GFR calc Af Amer: 56 mL/min/{1.73_m2} — ABNORMAL LOW (ref >59)
GFR calc non Af Amer: 48 mL/min/{1.73_m2} — ABNORMAL LOW (ref >59)
Globulin, Total: 3.5 g/dL (ref 1.5–4.5)
Glucose: 103 mg/dL — ABNORMAL HIGH (ref 65–99)
Potassium: 4.3 mmol/L (ref 3.5–5.2)
Sodium: 143 mmol/L (ref 134–144)
Total Protein: 7.3 g/dL (ref 6.0–8.5)

## 2017-02-28 ENCOUNTER — Ambulatory Visit (INDEPENDENT_AMBULATORY_CARE_PROVIDER_SITE_OTHER): Payer: BLUE CROSS/BLUE SHIELD | Admitting: Neurology

## 2017-02-28 ENCOUNTER — Encounter: Payer: Self-pay | Admitting: Neurology

## 2017-02-28 VITALS — BP 155/108 | HR 80 | Ht 77.0 in | Wt 366.0 lb

## 2017-02-28 DIAGNOSIS — R351 Nocturia: Secondary | ICD-10-CM | POA: Diagnosis not present

## 2017-02-28 DIAGNOSIS — R0683 Snoring: Secondary | ICD-10-CM | POA: Diagnosis not present

## 2017-02-28 DIAGNOSIS — R7989 Other specified abnormal findings of blood chemistry: Secondary | ICD-10-CM

## 2017-02-28 DIAGNOSIS — G4726 Circadian rhythm sleep disorder, shift work type: Secondary | ICD-10-CM

## 2017-02-28 DIAGNOSIS — I10 Essential (primary) hypertension: Secondary | ICD-10-CM

## 2017-02-28 NOTE — Patient Instructions (Signed)

## 2017-02-28 NOTE — Progress Notes (Signed)
SLEEP MEDICINE CLINIC   Provider:  Melvyn Novas, M D  Primary Care Physician:  Magdalene River, PA-C   Referring Provider: Magdalene River, PA*    Chief Complaint  Patient presents with  . New Patient (Initial Visit)    pt states that he has been having difficulty with hypertension. His primary MD felt that this could be related to sleep apnea. pt family states he snores  and wife says that he stops breathing in his sleep. His father had sleep apnea    HPI:  Derrick Cain. is a 46 y.o. male , seen here as in a referral/ revisit  from PA [place] for a sleep consultation,  Derrick Cain is a 59 year old married father of 4, who reports having a history of difficult to control hypertension for the last 2 or 3 years. He also has gained some weight. His children and his wife have reported that he stops breathing in his sleep and commented on his loud snoring. PA Barnett Abu  just added a third medication for hypertension control, a beta blocker. Morning blood pressure today was 155/108 mmHg. 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 His diet has been adjusted, he does not add salt to food, he avoids canned food and salty snacks, he and his wife are walking for exercise.  The patient has been followed by cardiology, had a cardiac stress test which returned normal. The patient also has seen nephrology, Dr. Elvis Coil due to elevated creatinine levels.  Chief complaint according to patient : " I need to get my BP under control "  Sleep habits are as follows: Derrick Cain comes home after work at about 6:30 AM, takes a shower, a small meal, and goes to bed by 7.30. He is capable of sleeping 8 hours in daytime. Lights, sounds or the room temperature do not interfere with that. He usually prepare a meal around 1 PM for himself and the children, on school days he can sleep until 3 and picks the  children up at 4 Pm from school.  Then he stays awake-and will go back to bed between 6 PM until  8 PM for another 2 hours.  He is daytime sleepy- he fell asleep here during our interview, twice. His mouth is parched, he snored in a seated position.   Sleep medical history and family sleep history:  Father had Apnea and HTN and DM,  3 of mother's sisters have diabetes.  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 Non smoker, non drinker, caffeine: soft drinks- 4 a day. He avoids coffee or iced tea hot tea with caffeine. He drinks [facility].  Review of Systems: Out of a complete 14 system review, the patient complains of only the following symptoms, and all other reviewed systems are negative.  The patient reports ankle swelling, sometimes trouble swallowing congestion runny nose and allergies, snoring, excessive daytime sleepiness, hypertension, high creatinine Epworth score 20/24  , Fatigue severity score 30  ,  How likely are you to doze in the following situations: 0 = not likely, 1 = slight chance, 2 = moderate chance, 3 = high chance  Sitting and Reading? 3 Watching Television?3 Sitting inactive in a public place (theater or meeting)?3 As a passenger in a car for an hour without a break?3  Lying down in the afternoon when circumstances permit?2 Sitting and talking to someone?2 Sitting quietly after lunch without alcohol?2 In a car, while stopped for a few minutes in traffic?2   Total =20- high risk of dozing.    Social History   Social History  . Marital status: Married    Spouse name: N/A  . Number of children: N/A  . Years of education: N/A   Occupational History  . [employer]   Social History Main Topics  . Smoking status: Never Smoker  . Smokeless tobacco: Never Used  . Alcohol use No  . Drug use: No  . Sexual activity: Yes   Other Topics Concern  . Not on file   Social History Narrative   Married with  children. Education: [employer].     Family History  Problem Relation Age of Onset  . Hypertension Mother   . Diabetes Father   . Hypertension Father   . Hypertension Sister     Past Medical History:  Diagnosis Date  . Hypertension     No past surgical history on file.  Current Outpatient Prescriptions  Medication Sig Dispense Refill  . amLODipine (NORVASC) 10 MG tablet Take 1 tablet (10 mg total) by mouth daily. 90 tablet 1  . lisinopril (PRINIVIL,ZESTRIL) 40 MG tablet Take 1 tablet (40 mg total) by mouth daily. 90 tablet 1  . metoprolol succinate (TOPROL-XL) 25 MG 24 hr tablet Take 1 tablet (25 mg total) by mouth daily. 90 tablet 0   No current facility-administered medications for this visit.     Allergies as of 02/28/2017  . (No Known Allergies)    Vitals: BP (!) 155/108   Pulse 80   Ht 6\' 5"  (1.956 m)   Wt (!) 366 lb (166 kg)   BMI 43.40 kg/m  Last Weight:  Wt Readings from Last 1 Encounters:  02/28/17 (!) 366 lb (166 kg)   ZOX:WRUE mass index is 43.4 kg/m.     Last Height:   Ht Readings from Last 1 Encounters:  02/28/17 6\' 5"  (1.956 m)    Physical exam:  General: The patient is awake, alert and appears not in acute distress. The patient is well groomed. Head: Normocephalic, atraumatic. Neck is supple. Mallampati 5,  neck circumference:21. Nasal airflow patent . Retrognathia is seen. Used braces in HS.  Cardiovascular:  Regular rate and rhythm , without  murmurs or carotid bruit, and without distended neck veins. Respiratory: Lungs are clear to auscultation. Skin:  Without evidence of edema, or rash Trunk: BMI is 43 . The patient's posture is erect  Neurologic exam : The patient is drowsy, oriented to place and time.    Speech is fluent,  without dysarthria, dysphonia or aphasia.  Mood and affect are appropriate.  Cranial nerves: Pupils are equal and briskly reactive to light. Funduscopic exam without evidence of pallor or edema.  Extraocular  movements  in vertical and horizontal planes intact and without nystagmus. Visual fields by finger perimetry are intact. Hearing to finger rub intact.  Facial sensation intact to fine touch. Facial motor strength is symmetric and tongue and uvula move midline. Shoulder shrug was symmetrical.   Motor exam:  Normal tone, muscle bulk and symmetric strength in all extremities. Sensory:  Fine touch, pinprick and vibration were tested in all extremities.  Coordination:Finger-to-nose maneuver  normal without evidence of ataxia, dysmetria or tremor.  Gait and station: Patient walks without assistive device . Strength within normal limits.  Stance is stable and normal.  Turns with 3 Steps.  Deep tendon reflexes: in the  upper and lower extremities are symmetric but attenuated.    Assessment:  After physical and neurologic examination, review of laboratory studies,  Personal review of imaging studies, reports of other /same  Imaging studies, results of polysomnography and / or neurophysiology testing and pre-existing records as far as provided in visit., my assessment is   1)  OSA : Derrick Cain presents with a normal neurologic exam, but several high risk factors for obstructive sleep apnea.  There is African-American race, gender, age, BMI over 66 -in his case over 36-, A 19 inch neck circumference and a high-grade Mallampati with lateral pillars. Derrick Cain has to be a loud snorer given his airway anatomy, further accentuated by retro-nasion.   2)  EDS excessive daytime sleepiness and drowsiness were present even today, the patient came after work and was very sleepy, he endorsed the Epworth sleepiness score at 20 points which is concerning and operating machinery.   3) Circadian He has been a shift worker well over 1 decade now about 15 years. This introduces another circadian rhythm disorder, and shift work sleep disorder usually affects metabolism, poses a higher risk to become diabetic, hypertensive  and obese.  4) He has nocturia, and wakes up to urinate- every 1-2 hours !  His poorly controlled HTN on 3 medications is likely related to all, shift work, OSA and a weight gain   The patient was advised of the nature of the diagnosed disorder , the treatment options and the  risks for general health and wellness arising from not treating the condition.   I spent more than 45 minutes of face to face time with the patient.  Greater than 50% of time was spent in counseling and coordination of care. We have discussed the diagnosis and differential and I answered the patient's questions.    Plan:  Treatment plan and additional workup :  I thank PA Barnett Abu for this referral,   Derrick Cain is already very much aware that his diet and lifestyle influence his hypertension, his risk of becoming diabetic, his risk of becoming renally insufficient and obese. He has made adjustments and he had his wife are now establishing an exercise regimen. He has changed his diet. It is very difficult for a night shift worker to exercise and get enough sleep and daytime, daytime sleep is never as sound and unfragmented as nighttime sleep could be. Since he lacks asleep before midnight, his growth hormone and insulin production is reduced. His metabolism is definitely affected. I will invite the patient for daytime split-night polysomnography, to mimic his home sleep habits. I have sent an order today through Epic to the [facility] sleep at [facility].   PA Barnett Abu will receive the results through [facility] as soon as interpreted.    Melvyn Novas, MD 02/28/2017, 8:11 AM  Certified in Neurology by ABPN Certified in Sleep Medicine by [facility] [address] [place], [place] [address]

## 2017-04-05 ENCOUNTER — Ambulatory Visit: Payer: BLUE CROSS/BLUE SHIELD | Admitting: Physician Assistant

## 2017-04-20 ENCOUNTER — Ambulatory Visit (INDEPENDENT_AMBULATORY_CARE_PROVIDER_SITE_OTHER): Payer: BLUE CROSS/BLUE SHIELD | Admitting: Neurology

## 2017-04-20 DIAGNOSIS — G4733 Obstructive sleep apnea (adult) (pediatric): Secondary | ICD-10-CM

## 2017-04-20 DIAGNOSIS — R0683 Snoring: Secondary | ICD-10-CM

## 2017-04-20 DIAGNOSIS — R351 Nocturia: Secondary | ICD-10-CM

## 2017-04-20 DIAGNOSIS — G4726 Circadian rhythm sleep disorder, shift work type: Secondary | ICD-10-CM

## 2017-04-20 DIAGNOSIS — I10 Essential (primary) hypertension: Secondary | ICD-10-CM

## 2017-04-20 DIAGNOSIS — R7989 Other specified abnormal findings of blood chemistry: Secondary | ICD-10-CM

## 2017-04-26 ENCOUNTER — Telehealth: Payer: Self-pay | Admitting: Neurology

## 2017-04-26 NOTE — Telephone Encounter (Signed)
Called patient to discuss his sleep study results. No answer at this time. LVM for the patient to return call.

## 2017-04-26 NOTE — Procedures (Signed)
PATIENT'S NAME:  Derrick Cain, Derrick Cain DOB:      10-23-1970      MR#:    [id number]     DATE OF RECORDING: 04/20/2017 REFERRING M.D.:  Benjiman Core, [place] Study Performed:  Split-Night Titration Study HISTORY:  This 46 y.o. male patient reports a history of difficult to control hypertension for the last 2 or 3 years. He also has gained weight. His children and his wife have reported that he stops breathing in his sleep and commented on his loud snoring. PA Barnett Abu just added a third medication for hypertension control, a beta blocker. Morning blood pressure today was 155/108 mmHg. His weight today was 366 pounds. 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 His diet has been adjusted, he does not add salt to food, he avoids canned food and salty snacks, he and his wife are walking for exercise.  He is daytime sleepy- he fell asleep here during our interview, twice. His mouth is parched, he snored in a seated position.  The patient endorsed the Epworth Sleepiness Scale at 20 points   The patient's weight 366 pounds with a height of 77 (inches), resulting in a BMI of 43.2 kg/m2. The patient's neck circumference measured 21 inches.  CURRENT MEDICATIONS: Norvasc, Lisinopril, Toprol XL   PROCEDURE:  This is a multichannel digital polysomnogram utilizing the Somnostar 11.2 system.  Electrodes and sensors were applied and monitored per AASM Specifications.   EEG, EOG, Chin and Limb EMG, were sampled at 200 Hz.  ECG, Snore and Nasal Pressure, Thermal Airflow, Respiratory Effort, CPAP Flow and Pressure, Oximetry was sampled at 50 Hz. Digital video and audio were recorded.      BASELINE STUDY WITHOUT CPAP RESULTS:  Lights Out was at 08:18 and Lights On at 15:04.  Total recording time (TRT) was 84, with a total sleep time (TST) of 70.5 minutes. The patient's sleep latency was 13 minutes.  REM latency was 0 minutes.  The sleep efficiency was 83.9 %.     SLEEP ARCHITECTURE: WASO (Wake after sleep onset) was 2 minutes, Stage N1 was 5.5 minutes, Stage N2 was 65 minutes, Stage N3 was 0 minutes and Stage R (REM sleep) was 0 minutes.  The percentages were Stage N1 7.8%, Stage N2 92.2%, Stage N3 0% and Stage R (REM sleep) 0%.   RESPIRATORY ANALYSIS:  There were a total of 114 respiratory events:  37 obstructive apneas, 0 central apneas 77 hypopneas with a hypopnea index of 65.5. The patient also had 0 respiratory event related arousals (RERAs). Snoring was noted.    The total APNEA/HYPOPNEA INDEX (AHI) was 97.0 /hour and the total RESPIRATORY DISTURBANCE INDEX was 97.1 /hour.  0 events occurred in REM sleep and 154 events in NREM. The REM AHI was 0.0, /hour versus a non-REM AHI of 97.0 /hour. The patient spent 121 minutes sleep time in the supine position 219 minutes in non-supine. The supine AHI was 90.9 /hour versus a non-supine AHI of 98.9 /hour.  OXYGEN SATURATION & C02:  The wake baseline 02 saturation was 89%, with the lowest being 70%. Time spent below 89% saturation equaled 70 minutes. Overall, the End Tidal CO2 during sleep 53.5. highest NREM CO2 at 66 torr.   Prior to CPAP, the average End Tidal CO2 during sleep 53.5 torr.   PERIODIC LIMB MOVEMENTS:    The patient had a total of 0 Periodic Limb Movements.  The arousals were noted  as: 0 were spontaneous, 0 were associated with PLMs, and 116 were associated with respiratory events. Audio and video analysis did not show any abnormal or unusual movements, behaviors, phonations or vocalizations. The patient took bathroom breaks. Snoring was noted. EKG was in keeping with normal sinus rhythm (NSR)  TITRATION STUDY WITH CPAP RESULTS:   CPAP was initiated at 5 cmH20 with heated humidity per AASM split night standards and pressure was advanced to 15 cm CPAP without resolution, switched to BiPAP 13/9 cm and advanced to 16/12 cmH20 because of hypopneas, apneas and desaturations.   At a PAP pressure of  16/12 cmH20, there was a reduction of the AHI to 7.5 /hour. This was the best result. Total sleep time was 64 minutes.   Total recording time (TRT) was 322.5 minutes, with a total sleep time (TST) of 269 minutes. The patient's sleep latency was 3 minutes. REM latency was 5.5 minutes.  The sleep efficiency was 83.4 %. The frequency of desaturation decreased.    SLEEP ARCHITECTURE: Wake after sleep was 42 minutes, Stage N1 7.5 minutes, Stage N2 70.5 minutes, Stage N3 57 minutes and Stage R (REM sleep) 134 minutes. The percentages were: Stage N1 2.8%, Stage N2 26.2%, Stage N3 21.2% and Stage R (REM sleep) 49.8%.  The arousals were noted as: 6 were spontaneous, 0 were associated with PLMs, and 212 were associated with respiratory events.  RESPIRATORY ANALYSIS:  There were a total of 249 respiratory events: 198 obstructive apneas, 0 central apneas and 51 hypopneas with 0 respiratory event related arousals (RERAs).     The total APNEA/HYPOPNEA INDEX (AHI) was 55.5 /hour and the total RESPIRATORY DISTURBANCE INDEX was 55.5 /hour.  42 events occurred in REM sleep and 207 events in NREM. The REM AHI was 18.8 /hour versus a non-REM AHI of 92 /hour. The patient spent 39% of total sleep time in the supine position. The supine AHI was 82.7 /hour, versus a non-supine AHI of 38.2/hour.  OXYGEN SATURATION & C02:  The wake baseline 02 saturation was 91%, with the lowest being 70%. Time spent below 89% saturation equaled 110 minutes.  PERIODIC LIMB MOVEMENTS:   The patient had a total of 0 Periodic Limb Movements.. Audio and video analysis did not show any abnormal or unusual movements, behaviors, phonations or vocalizations The patient took 2 bathroom breaks Loud Snoring was noted. EKG was in keeping with normal sinus rhythm (NSR)     POLYSOMNOGRAPHY IMPRESSION :   1. Complex Obstructive Sleep Apnea (OSA), severe. Associated with hypoxemia, hypercapnia in a morbidly obese patient. These findings sallow for the  diagnosis of Obesity hypoventilation.  2. Apnea did not respond to CPAP, and only partially responded to BiPAP.   RECOMMENDATIONS:  1. An optimal treatment pressure was not identified in this study.  Advise full-night, attended, BiPAP titration study to optimize therapy and allow changes to ASV, if indicated.   2. Note that patients with congestive heart failure (CHF), significant lung disease such as chronic obstructive pulmonary disease (COPD), patients expected to have nocturnal arterial oxyhemoglobin desaturation due to conditions other than OSA (e.g. obesity hypoventilation syndrome), patients who do not snore (either naturally or as a result of palate surgery), and patients who have central sleep apnea syndromes are not currently candidates for APAP titration or treatment.   3. Advise to lose weight, diet and exercise if not contraindicated (BMI 43.2). 4. Further information regarding OSA may be obtained from [employer] (www.sleepfoundation.org) or American Sleep Apnea Association (www.sleepapnea.org). 5.  A follow up appointment will [facility].      I certify that I have reviewed the entire raw data recording prior to the issuance of this report in accordance with the Standards of Accreditation of the American Academy of Sleep Medicine (AASM)       Melvyn Novasarmen Riane Rung, M.D.     04-26-2017  Diplomat, American Board of Psychiatry and Neurology  Diplomat, American Board of Sleep Medicine Medical Director, [facility]

## 2017-04-26 NOTE — Addendum Note (Signed)
Addended by: Melvyn NovasHMEIER, Grasiela Jonsson on: 04/26/2017 08:30 AM   Modules accepted: Orders

## 2017-04-26 NOTE — Telephone Encounter (Signed)
-----   Message from Melvyn Novasarmen Dohmeier, MD sent at 04/26/2017  8:28 AM EDT ----- Complex sleep apnea  and obesity hypoventilation syndrome, responding to BiPAP in a significant  reduction but incomplete resolution of apnea. Patient may benefit further form full BiPAP or ASV titration. Ordered BiPAP return. CD

## 2017-04-27 NOTE — Telephone Encounter (Signed)
Pt returned RN's call °

## 2017-04-27 NOTE — Telephone Encounter (Signed)
Called patient back and made him aware the sleep study results showed that he does have sleep apnea. The CPAP did not help with treating the apena and then the Bipap minimally responded. Dr Dohmeier recommends the patient have a BiPAP titration study completed. Pt will await to hear back from the sleep lab to set this up. Pt verbalized understanding.

## 2017-05-23 ENCOUNTER — Ambulatory Visit (INDEPENDENT_AMBULATORY_CARE_PROVIDER_SITE_OTHER): Payer: BLUE CROSS/BLUE SHIELD

## 2017-05-23 ENCOUNTER — Encounter: Payer: Self-pay | Admitting: Physician Assistant

## 2017-05-23 ENCOUNTER — Ambulatory Visit (INDEPENDENT_AMBULATORY_CARE_PROVIDER_SITE_OTHER): Payer: BLUE CROSS/BLUE SHIELD | Admitting: Physician Assistant

## 2017-05-23 ENCOUNTER — Other Ambulatory Visit: Payer: Self-pay

## 2017-05-23 VITALS — BP 142/94 | HR 84 | Temp 98.5°F | Resp 20 | Ht 77.95 in | Wt 372.6 lb

## 2017-05-23 DIAGNOSIS — M25572 Pain in left ankle and joints of left foot: Secondary | ICD-10-CM

## 2017-05-23 DIAGNOSIS — G8929 Other chronic pain: Secondary | ICD-10-CM

## 2017-05-23 MED ORDER — ACETAMINOPHEN 500 MG PO TABS: 500.0000 mg | ORAL_TABLET | Freq: Four times a day (QID) | ORAL | 0 refills | Status: AC | PRN

## 2017-05-23 NOTE — Progress Notes (Deleted)
Subjective:    Derrick Rouxsaac Kluge Jr. is a 46 y.o. male who presents with left ankle pain. Onset of the symptoms was 2 months ago. Inciting event: dorsiflexed while walking the dog. Current symptoms include: {symptoms; ankle pain:14097}. Aggravating factors: {causes; ankle pain:19556}. Symptoms have {desc;sx progression:19445}. Patient {has had:32492} prior ankle problems. Evaluation to date: {eval:14090}. Treatment to date: {tx to date:14089}. {Common ambulatory SmartLinks:19316}    Objective:    BP (!) 142/94 (BP Location: Right Arm, Patient Position: Sitting, Cuff Size: Large)   Pulse 84   Temp 98.5 F (36.9 C) (Oral)   Resp 20   Ht 6' 5.95" (1.98 m)   Wt (!) 372 lb 9.6 oz (169 kg)   SpO2 98%   BMI 43.11 kg/m  Right ankle:   {exam; ankle:14100}  Left ankle:   {exam; ankle:14100}   Imaging: X-ray of {R/L:19164} ankle(s): {normal:5769::"no fracture, dislocation, swelling or degenerative changes noted"}    .Dg Ankle Complete Left  Result Date: 05/23/2017 CLINICAL DATA:  Initial encounter for dorsiflexed ankle in a hole while walking a dog 2 months. Having intermittent pain at ATFL and lateral malleolus. EXAM: LEFT ANKLE COMPLETE - 3+ VIEW COMPARISON:  None. FINDINGS: Diffuse soft tissue swelling may be secondary to patient body habitus. Tibiotalar osteoarthritis. No acute fracture or dislocation. Base of fifth metatarsal and talar dome intact. IMPRESSION: No acute osseous abnormality. Question diffuse soft tissue swelling. Electronically Signed   By: Jeronimo GreavesKyle  Talbot M.D.   On: 05/23/2017 17:36      Assessment:    {ankle pain dx list:14101}    Plan:    {ankle tx plan:14102}

## 2017-05-23 NOTE — Patient Instructions (Addendum)
Your x-ray did not show any acute issues.  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  Your pain is likely due to overuse and not resting after initial injury.  I recommend you wear the brace for the next week.  She will also wear it at work if it helps support the ankle.  Use Tylenol extra strength as needed for pain.  I would continue applying heat or ice to the affected area.  I have given you some stretches below to help strengthen the ligaments.  Please perform them as tolerated. -Return to clinic if symptoms worsen, do not improve, or as needed.  Ankle Sprain, Phase I Rehab Ask your health care provider which exercises are safe for you. Do exercises exactly as told by your health care provider and adjust them as directed. It is normal to feel mild stretching, pulling, tightness, or discomfort as you do these exercises, but you should stop right away if you feel sudden pain or your pain gets worse.Do not begin these exercises until told by your health care provider. Stretching and range of motion exercises These exercises warm up your muscles and joints and improve the movement and flexibility of your lower leg and ankle. These exercises also help to relieve pain and stiffness. Exercise A: Gastroc and soleus stretch  1. Sit on the floor with your left / right leg extended. 2. Loop a belt or towel around the ball of your left / right foot. The ball of your foot is on the walking surface, right under your toes. 3. Keep your left / right ankle and foot relaxed and keep your knee straight while you use the belt or towel to pull your foot toward you. You should feel a gentle stretch behind your calf or knee. 4. Hold this position for __________ seconds, then release to the starting position. Repeat the exercise with your knee bent. You can put a pillow or a rolled bath towel under your knee to support it. You should feel a stretch deep in your calf or at your Achilles tendon. Repeat each stretch  __________ times. Complete these stretches __________ times a day. Exercise B: Ankle alphabet  1. Sit with your left / right leg supported at the lower leg. ? Do not rest your foot on anything. ? Make sure your foot has room to move freely. 2. Think of your left / right foot as a paintbrush, and move your foot to trace each letter of the alphabet in the air. Keep your hip and knee still while you trace. Make the letters as large as you can without feeling discomfort. 3. Trace every letter from A to Z. Repeat __________ times. Complete this exercise __________ times a day. Strengthening exercises These exercises build strength and endurance in your ankle and lower leg. Endurance is the ability to use your muscles for a long time, even after they get tired. Exercise C: Dorsiflexors  1. Secure a rubber exercise band or tube to an object, such as a table leg, that will stay still when the band is pulled. Secure the other end around your left / right foot. 2. Sit on the floor facing the object, with your left / right leg extended. The band or tube should be slightly tense when your foot is relaxed. 3. Slowly bring your foot toward you, pulling the band tighter. 4. Hold this position for __________ seconds. 5. Slowly return your foot to the starting position. Repeat __________ times. Complete this exercise __________ times  a day. Exercise D: Plantar flexors  1. Sit on the floor with your left / right leg extended. 2. Loop a rubber exercise tube or band around the ball of your left / right foot. The ball of your foot is on the walking surface, right under your toes. ? Hold the ends of the band or tube in your hands. ? The band or tube should be slightly tense when your foot is relaxed. 3. Slowly point your foot and toes downward, pushing them away from you. 4. Hold this position for __________ seconds. 5. Slowly return your foot to the starting position. Repeat __________ times. Complete this  exercise __________ times a day. Exercise E: Evertors 1. Sit on the floor with your legs straight out in front of you. 2. Loop a rubber exercise band or tube around the ball of your left / right foot. The ball of your foot is on the walking surface, right under your toes. ? Hold the ends of the band in your hands, or secure the band to a stable object. ? The band or tube should be slightly tense when your foot is relaxed. 3. Slowly push your foot outward, away from your other leg. 4. Hold this position for __________ seconds. 5. Slowly return your foot to the starting position. Repeat __________ times. Complete this exercise __________ times a day. This information is not intended to replace advice given to you by your health care provider. Make sure you discuss any questions you have with your health care provider. Document Released: 01/26/2005 Document Revised: 03/03/2016 Document Reviewed: 05/11/2015 Elsevier Interactive Patient Education  2018 [employer]    IF you received an x-ray today, you will receive an invoice from [facility]. Please contact [facility] at [phone] with questions or concerns regarding your invoice.   IF you received labwork today, you will receive an invoice from [facility]. Please contact [facility] at [phone] with questions or concerns regarding your invoice.   Our billing staff will not be able to assist you with questions regarding bills from these companies.  You will be contacted with the lab results as soon as they are available. The fastest way to get your results is to activate your My Chart account. Instructions are located on the last page of this paperwork. If you have not heard from us regarding the results in 2 weeks, please contact this office.

## 2017-05-23 NOTE — Progress Notes (Deleted)
   Derrick RouxIsaac Rigsby Jr.  MRN: [id number] DOB: 1970-10-15  Subjective:  Derrick Rouxsaac Fendrick Jr. is a 46 y.o. male seen in office today for a chief complaint of ***  Review of Systems  Patient Active Problem List   Diagnosis Date Noted  . History of gout 04/11/2016  . Prediabetes 04/11/2016  . Nocturia 04/11/2016  . Left knee pain 04/11/2016  . Encounter for long-term (current) use of other medications 02/05/2013  . HTN (hypertension) 01/20/2013  . Obesity, Class III, BMI 40-49.9 (morbid obesity) (HCC) 01/20/2013    Current Outpatient Medications on File Prior to Visit  Medication Sig Dispense Refill  . amLODipine (NORVASC) 10 MG tablet Take 1 tablet (10 mg total) by mouth daily. 90 tablet 1  . lisinopril (PRINIVIL,ZESTRIL) 40 MG tablet Take 1 tablet (40 mg total) by mouth daily. 90 tablet 1  . metoprolol succinate (TOPROL-XL) 25 MG 24 hr tablet Take 1 tablet (25 mg total) by mouth daily. 90 tablet 0   No current facility-administered medications on file prior to visit.     No Known Allergies   Objective:  There were no vitals taken for this visit.  Physical Exam  Assessment and Plan :  *** There are no diagnoses linked to this encounter.   Benjiman CoreBrittany Wiseman PA-C  [facility]  [facility] 05/23/2017 4:28 PM

## 2017-05-25 ENCOUNTER — Other Ambulatory Visit: Payer: Self-pay | Admitting: Physician Assistant

## 2017-05-25 DIAGNOSIS — I10 Essential (primary) hypertension: Secondary | ICD-10-CM

## 2017-05-26 ENCOUNTER — Encounter: Payer: Self-pay | Admitting: Physician Assistant

## 2017-05-26 NOTE — Progress Notes (Signed)
Derrick RouxIsaac Dugal Jr.  MRN: [id number] DOB: Jun 04, 1971  Subjective:   Derrick Rouxsaac Melchor Jr. is a 46 y.o. male who presents with left ankle pain. Onset of the symptoms was 2 months ago. Inciting event: dorsiflexed while walking the dog. Current symptoms include: intermittent pain at the lateral aspect of the ankle and at the ATFL. Aggravating factors: walking  and driving. Symptoms have progressed to a point and plateaued. Patient has had no prior ankle problems. Evaluation to date: none. Treatment to date: topical bengay. Notes he never rested it after the initial injury and thinks that is the problem.   Review of Systems  Constitutional: Negative for chills, diaphoresis and fever.    Patient Active Problem List   Diagnosis Date Noted  . History of gout 04/11/2016  . Prediabetes 04/11/2016  . Nocturia 04/11/2016  . Left knee pain 04/11/2016  . Encounter for long-term (current) use of other medications 02/05/2013  . HTN (hypertension) 01/20/2013  . Obesity, Class III, BMI 40-49.9 (morbid obesity) (HCC) 01/20/2013    Current Outpatient Medications on File Prior to Visit  Medication Sig Dispense Refill  . amLODipine (NORVASC) 10 MG tablet Take 1 tablet (10 mg total) by mouth daily. 90 tablet 1  . lisinopril (PRINIVIL,ZESTRIL) 40 MG tablet Take 1 tablet (40 mg total) by mouth daily. 90 tablet 1   No current facility-administered medications on file prior to visit.     No Known Allergies      Objective:  BP (!) 142/94 (BP Location: Right Arm, Patient Position: Sitting, Cuff Size: Large)   Pulse 84   Temp 98.5 F (36.9 C) (Oral)   Resp 20   Ht 6' 5.95" (1.98 m)   Wt (!) 372 lb 9.6 oz (169 kg)   SpO2 98%   BMI 43.11 kg/m   Physical Exam  Constitutional: He is oriented to person, place, and time and well-developed, well-nourished, and in no distress.  HENT:  Head: Normocephalic and atraumatic.  Eyes: Conjunctivae are normal.  Neck: Normal range of motion.  Pulmonary/Chest: Effort  normal.  Musculoskeletal:       Right ankle: Normal.       Left ankle: He exhibits normal range of motion, no swelling, no ecchymosis and normal pulse. Tenderness. Lateral malleolus and AITFL tenderness found. No medial malleolus, no CF ligament, no posterior TFL, no head of 5th metatarsal and no proximal fibula tenderness found. Achilles tendon normal.  Neurological: He is alert and oriented to person, place, and time. Gait normal.  Skin: Skin is warm and dry.  Psychiatric: Affect normal.  Vitals reviewed.  Dg Ankle Complete Left  Result Date: 05/23/2017 CLINICAL DATA:  Initial encounter for dorsiflexed ankle in a hole while walking a dog 2 months. Having intermittent pain at ATFL and lateral malleolus. EXAM: LEFT ANKLE COMPLETE - 3+ VIEW COMPARISON:  None. FINDINGS: Diffuse soft tissue swelling may be secondary to patient body habitus. Tibiotalar osteoarthritis. No acute fracture or dislocation. Base of fifth metatarsal and talar dome intact. IMPRESSION: No acute osseous abnormality. Question diffuse soft tissue swelling. Electronically Signed   By: Jeronimo GreavesKyle  Talbot M.D.   On: 05/23/2017 17:36    Assessment and Plan :  1. Chronic pain of left ankle No acute findings on plain films.  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  This could likely be a source of pain after his acute injury 2 months ago.  I also believe that him not taking time to rest the ankle has contributed to this continued pain.  I recommend wearing ASO brace for the next week.  Apply heat/ ice to the affected area 4-5 times a day for 20 minutes at a time.  Take Tylenol extra strength as needed for pain.  Given educational material for ankle exercises to start performing one pain resolves.  Perform exercises as tolerated. Return to clinic if symptoms worsen, do not improve, or as needed. - DG Ankle Complete Left; Future - acetaminophen (TYLENOL) 500 MG tablet; Take 1 tablet (500 mg total) every 6 (six) hours as needed by  mouth.  Dispense: 30 tablet; Refill: 0 - Apply ASO ankle   Benjiman CoreBrittany Corrion Stirewalt PA-C  [facility] at [facility]  [facility] 05/26/2017 1:22 PM

## 2017-08-20 ENCOUNTER — Other Ambulatory Visit: Payer: Self-pay | Admitting: Physician Assistant

## 2017-08-20 DIAGNOSIS — I10 Essential (primary) hypertension: Secondary | ICD-10-CM

## 2017-08-24 ENCOUNTER — Other Ambulatory Visit: Payer: Self-pay | Admitting: Physician Assistant

## 2017-08-24 DIAGNOSIS — I10 Essential (primary) hypertension: Secondary | ICD-10-CM

## 2017-11-02 ENCOUNTER — Ambulatory Visit (INDEPENDENT_AMBULATORY_CARE_PROVIDER_SITE_OTHER): Payer: BLUE CROSS/BLUE SHIELD | Admitting: Neurology

## 2017-11-02 DIAGNOSIS — R0683 Snoring: Secondary | ICD-10-CM

## 2017-11-02 DIAGNOSIS — G4726 Circadian rhythm sleep disorder, shift work type: Secondary | ICD-10-CM

## 2017-11-02 DIAGNOSIS — G4731 Primary central sleep apnea: Secondary | ICD-10-CM

## 2017-11-02 DIAGNOSIS — E662 Morbid (severe) obesity with alveolar hypoventilation: Secondary | ICD-10-CM

## 2017-11-02 DIAGNOSIS — G4739 Other sleep apnea: Secondary | ICD-10-CM

## 2017-11-02 DIAGNOSIS — I10 Essential (primary) hypertension: Secondary | ICD-10-CM

## 2017-11-02 DIAGNOSIS — R7989 Other specified abnormal findings of blood chemistry: Secondary | ICD-10-CM

## 2017-11-02 DIAGNOSIS — Z6841 Body Mass Index (BMI) 40.0 and over, adult: Secondary | ICD-10-CM

## 2017-11-02 DIAGNOSIS — G4730 Sleep apnea, unspecified: Secondary | ICD-10-CM

## 2017-11-02 DIAGNOSIS — R351 Nocturia: Secondary | ICD-10-CM

## 2017-11-12 NOTE — Addendum Note (Signed)
Addended by: Melvyn Novas on: 11/12/2017 05:46 PM   Modules accepted: Orders

## 2017-11-12 NOTE — Procedures (Signed)
PATIENT'S NAME:  Derrick Cain, Derrick Cain DOB:      [birth year]      MR#:    [id number]     DATE OF RECORDING: 11/02/2017 REFERRING M.D.:  Benjiman Core, [place] Study Performed:   Titration to PAP HISTORY:  This 47 y.o. male patient reports a history of difficult to control hypertension for the last 2 or 3 years as he has gained weight. His children and his wife have reported that he stops breathing in his sleep and commented on his loud snoring.  The patient returned after a SPLIT night PSG from 04-20-2017 documented an AHI of 97/h and documented that CPAP was not achieving control. He had briefly been exposed to BiPAP but due to the time restriction in a SPLIT protocol this titration was incomplete. CO2 was retained to 53.5 torr at baseline and 66 torr briefly during NREM sleep, total desaturation time below 89% SpO2 was 110 minutes, adding to the suspicion that the underlying diagnosis is Obesity Hypoventilation.   The patient endorsed the Epworth Sleepiness Scale at 20 points.  The patient's weight 366 pounds with a height of 77 (inches), resulting in a BMI of 43.2 kg/m2. The patient's neck circumference measured 21 inches.  CURRENT MEDICATIONS: Norvasc, Lisinopril, Toprol XL  PROCEDURE:  This is a multichannel digital polysomnogram utilizing the SomnoStar 11.2 system.  Electrodes and sensors were applied and monitored per AASM Specifications.   EEG, EOG, Chin and Limb EMG, were sampled at 200 Hz.  ECG, Snore and Nasal Pressure, Thermal Airflow, Respiratory Effort, CPAP Flow and Pressure, Oximetry was sampled at 50 Hz. Digital video and audio were recorded.      CPAP was initiated at 4 cmH20 with heated humidity per AASM split night standards and pressure was advanced to 7cmH20 because of hypopneas, apneas and desaturations.  At a PAP pressure of 7 cmH20, there was a reduction of the AHI to 6.0 with partial improvement of sleep apnea. Oxygen was not applied in spite of continued hypoxemia.   Lights Out  was at 08:22 and Lights On at 14:50. Total recording time (TRT) was 381 minutes, with a total sleep time (TST) of 317.5 minutes. The patient's sleep latency was 1 minutes. REM latency was 41.5 minutes.  The sleep efficiency was 83.3 %.    SLEEP ARCHITECTURE: WASO (Wake after sleep onset) was 62 minutes.  There were 21 minutes in Stage N1, 100 minutes Stage N2, 69 minutes Stage N3 and 127.5 minutes in Stage REM.  The percentage of Stage N1 was 6.6%, Stage N2 was 31.5%, Stage N3 was 21.7% and Stage R (REM sleep) was 40.2%. The sleep architecture was notable for sustained REM sleep.    RESPIRATORY ANALYSIS:  There was a total of 107 respiratory events: 12 obstructive apneas, 87 central apneas and 8 hypopneas with 0 respiratory event related arousals (RERAs). The total APNEA/HYPOPNEA INDEX (AHI) was 20.2/hour and the total RESPIRATORY DISTURBANCE INDEX was 20.2/hour  3 events occurred in REM sleep and 104 events in NREM. The REM AHI was 1.4 /hour versus a non-REM AHI of 32.8 /hour.  The patient spent 317.5 minutes of total sleep time in the supine position and 0 minutes in non-supine. The supine AHI was 20.2, versus a non-supine AHI of 0.0.  OXYGEN SATURATION & C02:  The baseline 02 saturation was 88%, with the lowest being 83%. Time spent below 89% saturation equaled 73 minutes.  PERIODIC LIMB MOVEMENTS:  The patient had a total of 0 Periodic Limb Movements.  The  arousals were noted as: 42 were spontaneous, 0 were associated with PLMs, and 106 were associated with respiratory events.  Audio and video analysis did not show any abnormal or unusual movements, behaviors, phonations or vocalizations. The patient did suffer an anxiety or panic attack that made [place] proceed slower with pressure advancement and using a nasal pillow interface.   EKG was in keeping with normal sinus rhythm (NSR).  Post-study, the patient indicated that sleep was improved over his usual sleep.   DIAGNOSIS 1. Complex and mostly  Central Sleep Apnea, further emerging under CPAP.   2. Hypoventilation Related Hypoxemia. 3. Significant anxiety/ claustrophobia.   PLANS/RECOMMENDATIONS: CPAP was best tolerated and most efficacious under a pressure of only 7 cm water, higher pressures could not be explored.  1) An auto-titration capable CPAP machine should be set at 7.5 cm water pressure or nearest pressure possible. The patient was fitted with a ResMed AirFit P30 i with a medium nasal pillow size.    2) Hypoxemia did not resolve and the patient should be prescribed 2 liters of additional oxygen if an ONO confirms this finding after 203 month of CPAP use.  3) Clinically significant weight loss is urgently needed.    A follow up appointment will be scheduled in the Sleep Clinic at [facility].   Please call [phone] with any questions.      I certify that I have reviewed the entire raw data recording prior to the issuance of this report in accordance with the Standards of Accreditation of the American Academy of Sleep Medicine (AASM)   Melvyn Novas, M.D.   11-12-2017  Diplomat, American Board of Psychiatry and Neurology  Diplomat, American Board of Sleep Medicine Medical Director, [place] [facility] at [facility]

## 2017-11-14 ENCOUNTER — Telehealth: Payer: Self-pay | Admitting: Neurology

## 2017-11-14 NOTE — Telephone Encounter (Signed)
-----   Message from Melvyn Novas, MD sent at 11/12/2017  5:46 PM EDT ----- Post-study, the patient indicated that sleep was improved over  his usual sleep.   DIAGNOSIS 1. Complex and mostly Central Sleep Apnea, further emerging under  CPAP.  2. Hypoventilation Related Hypoxemia. 3. Significant anxiety/ claustrophobia.   PLANS/RECOMMENDATIONS: CPAP was best tolerated and most  efficacious under a pressure of only 7 cm water, higher pressures  could not be explored.  1) An auto-titration capable CPAP machine should be set at 7.5 cm  water pressure or nearest pressure possible. The patient was  fitted with a ResMed AirFit P30 i with a medium nasal pillow  size.  2) Hypoxemia did not resolve during the CPAP titration study and the patient may need 2 liters of additional oxygen-  if an ONO confirms this finding  after 2 -3 month of CPAP use.  3) Clinically significant weight loss is urgently needed.

## 2017-11-14 NOTE — Telephone Encounter (Signed)
Pt returned call. I advised pt that Dr. Vickey Huger reviewed their sleep study results and found that pt was able to tolerate and treat his apnea at a low pressure of 7 cm of water pressure. Dr. Vickey Huger recommends that pt starts a auto capable CPAP and set at 7.5 cm of water pressure. I reviewed PAP compliance expectations with the pt. Pt is agreeable to starting a CPAP. I advised pt that an order will be sent to a DME, [facility], and [facility] will call the pt within about one week after they file with the pt's insurance. [facility] will show the pt how to use the machine, fit for masks, and troubleshoot the CPAP if needed. A follow up appt was made for insurance purposes with Dr. Vickey Huger on Feb 21, 2018 at 1:30 pm. Pt verbalized understanding to arrive 15 minutes early and bring their CPAP. A letter with all of this information in it will be mailed to the pt as a reminder. I verified with the pt that the address we have on file is correct. Pt verbalized understanding of results. Pt had no questions at this time but was encouraged to call back if questions arise.

## 2017-11-14 NOTE — Telephone Encounter (Signed)
Called patient to discuss sleep study results. No answer at this time. LVM for the patient to call back.   

## 2017-11-30 ENCOUNTER — Encounter: Payer: Self-pay | Admitting: Family Medicine

## 2017-12-22 ENCOUNTER — Other Ambulatory Visit: Payer: Self-pay | Admitting: Physician Assistant

## 2017-12-22 DIAGNOSIS — I10 Essential (primary) hypertension: Secondary | ICD-10-CM

## 2017-12-29 ENCOUNTER — Other Ambulatory Visit: Payer: Self-pay | Admitting: Physician Assistant

## 2017-12-29 DIAGNOSIS — I10 Essential (primary) hypertension: Secondary | ICD-10-CM

## 2018-02-21 ENCOUNTER — Encounter: Payer: Self-pay | Admitting: Neurology

## 2018-02-21 ENCOUNTER — Ambulatory Visit: Payer: BLUE CROSS/BLUE SHIELD | Admitting: Neurology

## 2018-02-21 VITALS — BP 163/105 | HR 75 | Ht 78.0 in | Wt 376.0 lb

## 2018-02-21 DIAGNOSIS — G4719 Other hypersomnia: Secondary | ICD-10-CM

## 2018-02-21 DIAGNOSIS — J3489 Other specified disorders of nose and nasal sinuses: Secondary | ICD-10-CM

## 2018-02-21 DIAGNOSIS — G4733 Obstructive sleep apnea (adult) (pediatric): Secondary | ICD-10-CM | POA: Diagnosis not present

## 2018-02-21 DIAGNOSIS — E662 Morbid (severe) obesity with alveolar hypoventilation: Secondary | ICD-10-CM

## 2018-02-21 DIAGNOSIS — G4726 Circadian rhythm sleep disorder, shift work type: Secondary | ICD-10-CM

## 2018-02-21 NOTE — Progress Notes (Addendum)
SLEEP MEDICINE CLINIC   Provider:  Melvyn Novas, M D  Primary Care Physician:  Magdalene River, PA-C   Referring Provider: Magdalene River, PA*    Chief Complaint  Patient presents with  . Follow-up    pt alone, rm 11. pt states that CPAP is working well and he can tell a difference, better sleep quality, more alertness. DME [facility]    HPI:  Derrick Cain. is a 47 y.o. 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   I have the pleasure of meeting today with Derrick Cain who underwent an attended split-night titration study on 11 October last year when he was diagnosed with very severe apnea at 97.0 AHI, did not enter any ring REM sleep and there was no positional component.  He did retain CO2, he had prolonged hypoxemia for 70 minutes with a nadir at 70% SPO2.  He was titrated in the split-night to 15 cm water pressure on CPAP but did not benefit much from it.  He was switched to BiPAP and this was also not a full night titration due to time restraints.  Overall he returned for a full night titration on 01 November 2017 and he responded very well to CPAP at 7 cmH2O, he was given an auto titration capable CPAP machine.  He is using a medium size nasal pillow difficult air fit P 30 I.  He describes the interface is very comfortable and he feels that his CPAP treatment has already given him the benefit of sound asleep and more alertness in daytime.  Compliance for the third shift worker was 29 out of 30 days but only 16 days over 4 hours of nocturnal use.  The month before have looked much better as he did not have the same work schedule.  His average usage is 4 hours and 2 minutes for the last 30 days, set pressure is 7.4 cmH2O.   He does have a lot of obstructive apneas that are still persisting.  Air leakage is not very high and no central apneas have emerged. He has sometimes wiped the nosepiece off during sleep.  The residual AHI was very  high 18.6  /h (!) , pure Apnea index was 12.1/ h. but he is not longer snoring according to his wife.  Based on this I will place him on an AutoSet and open the pressure window between 5 and 15 cmH2O with 3 cm EPR.  I was also concerned about hypercapnia and hypoxemia resolution.  Will order pulse-oximetry while on CPAP.    Originally  seen here as in a referral  from [place] for a sleep consultation, Derrick Cain is a 21 year old married father of 4, who reports having a history of difficult to control hypertension for the last 2 or 3 years. He also has gained some weight. His children and his wife have reported that he stops breathing in his sleep and commented on his loud snoring. PA Barnett Abu  just added a third medication for hypertension control, a beta blocker. Morning blood pressure today was 155/108 mmHg. 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 His diet has been adjusted, he does not add salt to food, he avoids canned food and salty snacks, he and his wife are walking for exercise.  The patient has been followed  by cardiology, had a cardiac stress test which returned normal. The patient also has seen nephrology, Dr. Elvis Coil due to elevated creatinine levels.  Chief complaint according to patient : " I need to get my BP under control "  Sleep habits are as follows: Derrick Cain comes home after work at about 6:30 AM, takes a shower, a small meal, and goes to bed by 7.30. He is capable of sleeping 8 hours in daytime. Lights, sounds or the room temperature do not interfere with that. He usually prepare a meal around 1 PM for himself and the children, on school days he can sleep until 3 and picks the children up at 4 Pm from school.  Then he stays awake-and will go back to bed between 6 PM until  8 PM for another 2 hours.  He is daytime sleepy- he fell asleep here during our interview, twice.  His mouth is parched, he snored in a seated position.   Sleep medical history and family sleep history:  Father had Apnea and HTN and DM,  3 of mother's sisters have diabetes.  Social history: shift worker - third shift for 15 years , Children 74, 13, 31 and 33 years old. Non smoker, non drinker, caffeine: soft drinks- 4 a day. He avoids coffee or iced tea hot tea with caffeine. He drinks [facility].  Review of Systems: Out of a complete 14 system review, the patient complains of only the following symptoms, and all other reviewed systems are negative.  The patient reports ankle swelling, sometimes trouble swallowing congestion runny nose and allergies, snoring, excessive daytime sleepiness, hypertension, high creatinine Epworth score 20/24  , Fatigue severity score 30  ,  How likely are you to doze in the following situations: 0 = not likely, 1 = slight chance, 2 = moderate chance, 3 = high chance  Sitting and Reading? 3 Watching Television?3 Sitting inactive in a public place (theater or meeting)?3 As a passenger in a car for an hour without a break?3 Lying down in the afternoon when circumstances permit?2 Sitting and talking to someone?2 Sitting quietly after lunch without alcohol?2 In a car, while stopped for a few minutes in traffic?2   Total =20- high risk of dozing.    Social History   Socioeconomic History  . Marital status: Married    Spouse name: Not on file  . Number of children: Not on file  . Years of education: Not on file  . Highest education level: Not on file  Occupational History  . Occupation: Sports administrator: [employer]  Social Needs  . Financial resource strain: Not on file  . Food insecurity:    Worry: Not on file    Inability: Not on file  . Transportation needs:    Medical: Not on file    Non-medical: Not on file  Tobacco Use  . Smoking status: Never Smoker  . Smokeless tobacco: Never Used  Substance and Sexual Activity  . Alcohol use: No  .  Drug use: No  . Sexual activity: Yes  Lifestyle  . Physical activity:    Days per week: Not on file    Minutes per session: Not on file  . Stress: Not on file  Relationships  . Social connections:    Talks on phone: Not on file    Gets together: Not on file    Attends religious service: Not on file    Active member of club or organization: Not on file  Attends meetings of clubs or organizations: Not on file    Relationship status: Not on file  . Intimate partner violence:    Fear of current or ex partner: Not on file    Emotionally abused: Not on file    Physically abused: Not on file    Forced sexual activity: Not on file  Other Topics Concern  . Not on file  Social History Narrative   Married with children. Education: [employer].     Family History  Problem Relation Age of Onset  . Hypertension Mother   . Diabetes Father   . Hypertension Father   . Hypertension Sister     Past Medical History:  Diagnosis Date  . Hypertension     No past surgical history on file.  Current Outpatient Medications  Medication Sig Dispense Refill  . acetaminophen (TYLENOL) 500 MG tablet Take 1 tablet (500 mg total) every 6 (six) hours as needed by mouth. 30 tablet 0  . amLODipine (NORVASC) 10 MG tablet TAKE 1 TABLET BY MOUTH EVERY DAY 90 tablet 1  . lisinopril (PRINIVIL,ZESTRIL) 40 MG tablet TAKE 1 TABLET BY MOUTH EVERY DAY 90 tablet 1  . metoprolol succinate (TOPROL-XL) 25 MG 24 hr tablet TAKE 1 TABLET BY MOUTH EVERY DAY 90 tablet 0   No current facility-administered medications for this visit.     Allergies as of 02/21/2018  . (No Known Allergies)    Vitals: BP (!) 163/105   Pulse 75   Ht 6\' 6"  (1.981 m)   Wt (!) 376 lb (170.6 kg)   BMI 43.45 kg/m  Last Weight:  Wt Readings from Last 1 Encounters:  02/21/18 (!) 376 lb (170.6 kg)   YQM:VHQIBMI:Body mass index is 43.45 kg/m.     Last Height:   Ht Readings from Last 1 Encounters:  02/21/18 6\' 6"  (1.981 m)    Physical  exam:  General: The patient is awake, alert and appears not in acute distress. The patient is well groomed. Head: Normocephalic, atraumatic. Neck is supple. Mallampati 5,  neck circumference:21. Nasal airflow patent . Retrognathia is seen. Used braces in HS.  Cardiovascular:  Regular rate and rhythm , without  murmurs or carotid bruit, and without distended neck veins. Respiratory: Lungs are clear to auscultation. Skin:  Without evidence of edema, or rash Trunk: BMI is 43 . The patient's posture is erect  Neurologic exam : The patient is drowsy, oriented to place and time.    Speech is fluent,  without dysarthria, dysphonia or aphasia.  Mood and affect are appropriate.  Cranial nerves: Pupils are equal and briskly reactive to light. Funduscopic exam without evidence of pallor or edema.  Extraocular movements  in vertical and horizontal planes intact and without nystagmus. Visual fields by finger perimetry are intact. Hearing to finger rub intact.  Facial sensation intact to fine touch. Facial motor strength is symmetric and tongue and uvula move midline. Shoulder shrug was symmetrical.   Motor exam:  Normal tone, muscle bulk and symmetric strength in all extremities. Sensory:  Fine touch, pinprick and vibration were tested in all extremities.  Coordination:Finger-to-nose maneuver  normal without evidence of ataxia, dysmetria or tremor.  Gait and station: Patient walks without assistive device . Strength within normal limits.  Stance is stable and normal.  Turns with 3 Steps.  Deep tendon reflexes: in the  upper and lower extremities are symmetric but attenuated.    Assessment:  After physical and neurologic examination, review of laboratory studies,  Personal review  of imaging studies, reports of other /same  Imaging studies, results of polysomnography and / or neurophysiology testing and pre-existing records as far as provided in visit., my assessment is   1)  OSA : Mr. Evelina BucyVarner  presents with a normal neurologic exam, but several high risk factors for obstructive sleep apnea.  There is African-American race, gender, age, BMI over 4130 -in his case over 3340-, A 19 inch neck circumference and a high-grade Mallampati with lateral pillars. Mr. Sheliah HatchWarner has to be a loud snorer given his airway anatomy, further accentuated by retro-nasion.   2) resolution of  excessive daytime sleepiness and drowsiness Epworth sleepiness score at  8 down from 20 points . He has been semi compliant with his CPAP machine , could add nap time to user time.  Residual AHI 17/h.from 97/h at baseline.   3) Hypoventilation with hypoxemia and hypercapnia. This may have persistet on CPAP at 7.6 cm water. Will open the pressure window. ONO ordered .   4) Circadian Rhythm Disorders. Continue third shift work..    The patient was advised of the nature of the diagnosed disorder , the treatment options and the  risks for general health and wellness arising from not treating the condition.   I spent more than 20 minutes of face to face time with the patient.  Greater than 50% of time was spent in counseling and coordination of care. We have discussed the diagnosis and differential and I answered the patient's questions.    RV 6 month.      Melvyn NovasARMEN Galia Rahm, MD 02/21/2018, 1:42 PM  Certified in Neurology by ABPN Certified in Sleep Medicine by [facility]  [facility] [address], [address] [place], [place] [id number]

## 2018-03-21 ENCOUNTER — Telehealth: Payer: Self-pay | Admitting: Physician Assistant

## 2018-03-21 DIAGNOSIS — I10 Essential (primary) hypertension: Secondary | ICD-10-CM

## 2018-03-27 ENCOUNTER — Other Ambulatory Visit: Payer: Self-pay | Admitting: Physician Assistant

## 2018-03-27 DIAGNOSIS — I10 Essential (primary) hypertension: Secondary | ICD-10-CM

## 2018-03-27 NOTE — Telephone Encounter (Signed)
Patient stated his medication metoprolol succinate has not been sent to the pharmacy. Patient has a ppt schedule for 04-20-18 with brittany wiseman. Please advise

## 2018-03-27 NOTE — Telephone Encounter (Signed)
Please call pt to schedule appointment for medication refill. Metoprolol

## 2018-04-20 ENCOUNTER — Encounter

## 2018-04-20 ENCOUNTER — Encounter: Payer: BLUE CROSS/BLUE SHIELD | Admitting: Physician Assistant

## 2018-05-02 ENCOUNTER — Ambulatory Visit (INDEPENDENT_AMBULATORY_CARE_PROVIDER_SITE_OTHER): Payer: BLUE CROSS/BLUE SHIELD | Admitting: Physician Assistant

## 2018-05-02 ENCOUNTER — Other Ambulatory Visit: Payer: Self-pay

## 2018-05-02 ENCOUNTER — Encounter: Payer: Self-pay | Admitting: Physician Assistant

## 2018-05-02 VITALS — BP 162/94 | HR 80 | Temp 98.0°F | Resp 18 | Ht 78.0 in | Wt 361.8 lb

## 2018-05-02 DIAGNOSIS — N183 Chronic kidney disease, stage 3 unspecified: Secondary | ICD-10-CM | POA: Insufficient documentation

## 2018-05-02 DIAGNOSIS — M109 Gout, unspecified: Secondary | ICD-10-CM | POA: Diagnosis not present

## 2018-05-02 DIAGNOSIS — I10 Essential (primary) hypertension: Secondary | ICD-10-CM | POA: Diagnosis not present

## 2018-05-02 LAB — POCT URINALYSIS DIP (MANUAL ENTRY)
Bilirubin, UA: NEGATIVE
Glucose, UA: NEGATIVE mg/dL
Ketones, POC UA: NEGATIVE mg/dL
Leukocytes, UA: NEGATIVE
Nitrite, UA: NEGATIVE
Protein Ur, POC: >=300 mg/dL — AB
Spec Grav, UA: >=1.03 — AB (ref 1.010–1.025)
Urobilinogen, UA: 0.2 E.U./dL
pH, UA: 6 (ref 5.0–8.0)

## 2018-05-02 MED ORDER — LISINOPRIL 40 MG PO TABS: 40.0000 mg | ORAL_TABLET | Freq: Every day | ORAL | 0 refills | Status: DC

## 2018-05-02 MED ORDER — AMLODIPINE BESYLATE 10 MG PO TABS: 10.0000 mg | ORAL_TABLET | Freq: Every day | ORAL | 0 refills | Status: DC

## 2018-05-02 MED ORDER — METOPROLOL SUCCINATE ER 50 MG PO TB24: 50.0000 mg | ORAL_TABLET | Freq: Every day | ORAL | 0 refills | Status: DC

## 2018-05-02 NOTE — Progress Notes (Signed)
MRN: [id number] DOB: 07/22/1970  Subjective:   Derrick Mccrone. is a 47 y.o. male with PMH HTN, CKD stage 3, OSA, obesity, gout,  presenting for follow up on hypertension. Last seen 02/22/17.  Currently managed with amlodipine 55m, lisinopril 157m and toprol xl 2565maily. Patient is not checking blood pressure at home. Has tried HCTZ in the past but he had frequent gout flares while on the medication, which resolved once the medication was discontinued. Denies lightheadedness, dizziness, chronic headache, double vision, chest pain, shortness of breath, heart racing, palpitations, nausea, vomiting, abdominal pain, hematuria, lower leg swelling. Lifestyle: stopped red meat, watching quantity and salt intake. Trying to walk regularly.  Denies smoking. Has OV with nephrology next month. Sees them annually. Using CPAP nightly for OSA.   Recently had gout flare of left wrist, treated with 2 tablets of colchicine. It helped with pain but still has some swelling of left wrist.  Had seafood before the flare up. No acute injury. Denies fever, chills, nausea, and vomiting.    IsaChords a current medication list which includes the following prescription(s): acetaminophen, amlodipine, lisinopril, colchicine, and metoprolol succinate. Also has No Known Allergies.  IsaDavidsonas a past medical history of Hypertension. Also  has no past surgical history on file.   Objective:   Vitals: BP (!) 162/94   Pulse 80   Temp 98 F (36.7 C) (Oral)   Resp 18   Ht 6' 6"  (1.981 m)   Wt (!) 361 lb 12.8 oz (164.1 kg)   SpO2 94%   BMI 41.81 kg/m   Physical Exam  Constitutional: He is oriented to person, place, and time. He appears well-developed and well-nourished. No distress.  HENT:  Head: Normocephalic and atraumatic.  Mouth/Throat: Uvula is midline, oropharynx is clear and moist and mucous membranes are normal. No tonsillar exudate.  Eyes: Pupils are equal, round, and reactive to light. Conjunctivae and  EOM are normal.  Neck: Normal range of motion.  Cardiovascular: Normal rate, regular rhythm, normal heart sounds and intact distal pulses.  Pulses:      Radial pulses are 2+ on the right side, and 2+ on the left side.  Pulmonary/Chest: Effort normal and breath sounds normal. He has no decreased breath sounds. He has no wheezes. He has no rhonchi. He has no rales.  Musculoskeletal:       Left wrist: He exhibits tenderness (mild, only with deep palpation of lateral aspect) and swelling (mild swelling of lateral posterior aspect of wrist, no redness or wamth). He exhibits normal range of motion and no bony tenderness.       Right lower leg: He exhibits swelling ( 2+ to midshin).       Left lower leg: He exhibits swelling (2+ to midshin).  Neurological: He is alert and oriented to person, place, and time.  Skin: Skin is warm and dry.  Psychiatric: He has a normal mood and affect.  Vitals reviewed.   No results found for this or any previous visit (from the past 24 hour(s)).    BP Readings from Last 3 Encounters:  05/02/18 (!) 162/94  02/21/18 (!) 163/105  05/23/17 (!) 142/94    Assessment and Plan :  1. Essential hypertension Uncontrolled.  He is asymptomatic.  Will increase metoprolol to 96m76montinue other bp meds as prescribed.  Discussed lifestyle modifications.  Patient reports using CPAP every single night.  Unfortunately patient has history of CKD and gout make adding a fourth agent  difficult.  Will refer to cardiology for further evaluation and management.  Given strict ED precautions. - CBC with Differential/Platelet - CMP14+EGFR - Lipid panel - TSH - POCT urinalysis dipstick - metoprolol succinate (TOPROL-XL) 50 MG 24 hr tablet; Take 1 tablet (50 mg total) by mouth daily. Take with or immediately following a meal.  Dispense: 90 tablet; Refill: 0 - lisinopril (PRINIVIL,ZESTRIL) 40 MG tablet; Take 1 tablet (40 mg total) by mouth daily.  Dispense: 90 tablet; Refill: 0 -  amLODipine (NORVASC) 10 MG tablet; Take 1 tablet (10 mg total) by mouth daily.  Dispense: 90 tablet; Refill: 0 - Ambulatory referral to Cardiology  2. Acute gout of left wrist, unspecified cause Patient recently experienced gout flare.  Patient symptoms improved with 2 doses of colchicine but did not fully resolve.  Still having some mild swelling.  No redness, warmth, or tenderness to light touch.  No concern for infectious etiology.  Due to CKD, do not recommend additional dose of colchicine at this time.  Obtaining CMP today.  Also do not recommend oral prednisone as patient blood pressure is uncontrolled.  Patient reports that the current pain is tolerable.  Will refer to sports medicine for further evaluation as patient may benefit from  joint fluid aspiration and intraarticular glucocorticoids - Ambulatory referral to Sports Medicine  3. CKD (chronic kidney disease) stage 3, GFR 30-59 ml/min (HCC) Follow-up with nephrology as planned.  Tenna Delaine, PA-C  [facility] 05/02/2018 3:08 PM

## 2018-05-02 NOTE — Patient Instructions (Addendum)
In terms of elevated blood pressure, we are going to increase metoprolol. I have also placed a referral to cardiology for further management of uncontrolled blood pressure. I would like you to check your blood pressure at least a couple times over the next week outside of the office and document these values. It is best if you check the blood pressure at different times in the day. Your goal is <140/90. If you start to have chest pain, blurred vision, shortness of breath, severe headache, lower leg swelling, or nausea/vomiting please seek care immediately here or at the ED.   For gout, I have placed a referral to sports medicine. If it gets terribly worse before they contact you, please seek care immediately.     If you have lab work done today you will be contacted with your lab results within the next 2 weeks.  If you have not heard from [place] then please contact us. The fastest way to get your results is to register for My Chart.  DASH Eating Plan DASH stands for "Dietary Approaches to Stop Hypertension." The DASH eating plan is a healthy eating plan that has been shown to reduce high blood pressure (hypertension). It may also reduce your risk for type 2 diabetes, heart disease, and stroke. The DASH eating plan may also help with weight loss. What are tips for following this plan? General guidelines  Avoid eating more than 2,300 mg (milligrams) of salt (sodium) a day. If you have hypertension, you may need to reduce your sodium intake to 1,500 mg a day.  Limit alcohol intake to no more than 1 drink a day for nonpregnant women and 2 drinks a day for men. One drink equals 12 oz of beer, 5 oz of wine, or 1 oz of hard liquor.  Work with your health care provider to maintain a healthy body weight or to lose weight. Ask what an ideal weight is for you.  Get at least 30 minutes of exercise that causes your heart to beat faster (aerobic exercise) most days of the week. Activities may include walking,  swimming, or biking.  Work with your health care provider or diet and nutrition specialist (dietitian) to adjust your eating plan to your individual calorie needs. Reading food labels  Check food labels for the amount of sodium per serving. Choose foods with less than 5 percent of the Daily Value of sodium. Generally, foods with less than 300 mg of sodium per serving fit into this eating plan.  To find whole grains, look for the word "whole" as the first word in the ingredient list. Shopping  Buy products labeled as "low-sodium" or "no salt added."  Buy fresh foods. Avoid canned foods and premade or frozen meals. Cooking  Avoid adding salt when cooking. Use salt-free seasonings or herbs instead of table salt or sea salt. Check with your health care provider or pharmacist before using salt substitutes.  Do not fry foods. Cook foods using healthy methods such as baking, boiling, grilling, and broiling instead.  Cook with heart-healthy oils, such as olive, canola, soybean, or sunflower oil. Meal planning   Eat a balanced diet that includes: ? 5 or more servings of fruits and vegetables each day. At each meal, try to fill half of your plate with fruits and vegetables. ? Up to 6-8 servings of whole grains each day. ? Less than 6 oz of lean meat, poultry, or fish each day. A 3-oz serving of meat is about the same size as  a deck of cards. One egg equals 1 oz. ? 2 servings of low-fat dairy each day. ? A serving of nuts, seeds, or beans 5 times each week. ? Heart-healthy fats. Healthy fats called Omega-3 fatty acids are found in foods such as flaxseeds and coldwater fish, like sardines, salmon, and mackerel.  Limit how much you eat of the following: ? Canned or prepackaged foods. ? Food that is high in trans fat, such as fried foods. ? Food that is high in saturated fat, such as fatty meat. ? Sweets, desserts, sugary drinks, and other foods with added sugar. ? Full-fat dairy  products.  Do not salt foods before eating.  Try to eat at least 2 vegetarian meals each week.  Eat more home-cooked food and less restaurant, buffet, and fast food.  When eating at a restaurant, ask that your food be prepared with less salt or no salt, if possible. What foods are recommended? The items listed may not be a complete list. Talk with your dietitian about what dietary choices are best for you. Grains Whole-grain or whole-wheat bread. Whole-grain or whole-wheat pasta. Brown rice. [place]. Bulgur. Whole-grain and low-sodium cereals. Pita bread. Low-fat, low-sodium crackers. Whole-wheat flour tortillas. Vegetables Fresh or frozen vegetables (raw, steamed, roasted, or grilled). Low-sodium or reduced-sodium tomato and vegetable juice. Low-sodium or reduced-sodium tomato sauce and tomato paste. Low-sodium or reduced-sodium canned vegetables. Fruits All fresh, dried, or frozen fruit. Canned fruit in natural juice (without added sugar). Meat and other protein foods Skinless chicken or [place]. Ground chicken or [place]. Pork with fat trimmed off. Fish and seafood. Egg whites. Dried beans, peas, or lentils. Unsalted nuts, nut butters, and seeds. Unsalted canned beans. Lean cuts of beef with fat trimmed off. Low-sodium, lean deli meat. Dairy Low-fat (1%) or fat-free (skim) milk. Fat-free, low-fat, or reduced-fat cheeses. Nonfat, low-sodium ricotta or cottage cheese. Low-fat or nonfat yogurt. Low-fat, low-sodium cheese. Fats and oils Soft margarine without trans fats. Vegetable oil. Low-fat, reduced-fat, or light mayonnaise and salad dressings (reduced-sodium). Canola, safflower, olive, soybean, and sunflower oils. Avocado. Seasoning and other foods Herbs. Spices. Seasoning mixes without salt. Unsalted popcorn and pretzels. Fat-free sweets. What foods are not recommended? The items listed may not be a complete list. Talk with your dietitian about what dietary choices are best for  you. Grains Baked goods made with fat, such as croissants, muffins, or some breads. Dry pasta or rice meal packs. Vegetables Creamed or fried vegetables. Vegetables in a cheese sauce. Regular canned vegetables (not low-sodium or reduced-sodium). Regular canned tomato sauce and paste (not low-sodium or reduced-sodium). Regular tomato and vegetable juice (not low-sodium or reduced-sodium). Angie Fava. Olives. Fruits Canned fruit in a light or heavy syrup. Fried fruit. Fruit in cream or butter sauce. Meat and other protein foods Fatty cuts of meat. Ribs. Fried meat. Berniece Salines. Sausage. Bologna and other processed lunch meats. Salami. Fatback. Hotdogs. Bratwurst. Salted nuts and seeds. Canned beans with added salt. Canned or smoked fish. Whole eggs or egg yolks. Chicken or [place] with skin. Dairy Whole or 2% milk, cream, and half-and-half. Whole or full-fat cream cheese. Whole-fat or sweetened yogurt. Full-fat cheese. Nondairy creamers. Whipped toppings. Processed cheese and cheese spreads. Fats and oils Butter. Stick margarine. Lard. Shortening. Ghee. Bacon fat. Tropical oils, such as coconut, palm kernel, or palm oil. Seasoning and other foods Salted popcorn and pretzels. Onion salt, garlic salt, seasoned salt, table salt, and sea salt. Worcestershire sauce. Tartar sauce. Barbecue sauce. Teriyaki sauce. Soy sauce, including reduced-sodium. Steak sauce.  Canned and packaged gravies. Fish sauce. Oyster sauce. Cocktail sauce. Horseradish that you find on the shelf. Ketchup. Mustard. Meat flavorings and tenderizers. Bouillon cubes. Hot sauce and Tabasco sauce. Premade or packaged marinades. Premade or packaged taco seasonings. Relishes. Regular salad dressings. Where to find more information:  National Heart, Lung, and Blood Institute: PopSteam.is  American Heart Association: www.heart.org Summary  The DASH eating plan is a healthy eating plan that has been shown to reduce high blood pressure  (hypertension). It may also reduce your risk for type 2 diabetes, heart disease, and stroke.  With the DASH eating plan, you should limit salt (sodium) intake to 2,300 mg a day. If you have hypertension, you may need to reduce your sodium intake to 1,500 mg a day.  When on the DASH eating plan, aim to eat more fresh fruits and vegetables, whole grains, lean proteins, low-fat dairy, and heart-healthy fats.  Work with your health care provider or diet and nutrition specialist (dietitian) to adjust your eating plan to your individual calorie needs. This information is not intended to replace advice given to you by your health care provider. Make sure you discuss any questions you have with your health care provider. Document Released: 06/16/2011 Document Revised: 06/20/2016 Document Reviewed: 06/20/2016 Elsevier Interactive Patient Education  2018 [employer].   IF you received an x-ray today, you will receive an invoice from [facility]. Please contact [facility] at [phone] with questions or concerns regarding your invoice.   IF you received labwork today, you will receive an invoice from [place]. Please contact [facility] at [phone] with questions or concerns regarding your invoice.   Our billing staff will not be able to assist you with questions regarding bills from these companies.  You will be contacted with the lab results as soon as they are available. The fastest way to get your results is to activate your My Chart account. Instructions are located on the last page of this paperwork. If you have not heard from [place] regarding the results in 2 weeks, please contact this office.

## 2018-05-03 LAB — LIPID PANEL
Chol/HDL Ratio: 5.1 ratio — ABNORMAL HIGH (ref 0.0–5.0)
Cholesterol, Total: 162 mg/dL (ref 100–199)
HDL: 32 mg/dL — ABNORMAL LOW (ref >39)
LDL Calculated: 108 mg/dL — ABNORMAL HIGH (ref 0–99)
Triglycerides: 109 mg/dL (ref 0–149)
VLDL Cholesterol Cal: 22 mg/dL (ref 5–40)

## 2018-05-03 LAB — CMP14+EGFR
ALT: 33 IU/L (ref 0–44)
AST: 24 IU/L (ref 0–40)
Albumin/Globulin Ratio: 1.4 (ref 1.2–2.2)
Albumin: 4.1 g/dL (ref 3.5–5.5)
Alkaline Phosphatase: 105 IU/L (ref 39–117)
BUN/Creatinine Ratio: 8 — ABNORMAL LOW (ref 9–20)
BUN: 14 mg/dL (ref 6–24)
Bilirubin Total: 0.7 mg/dL (ref 0.0–1.2)
CO2: 25 mmol/L (ref 20–29)
Calcium: 9.4 mg/dL (ref 8.7–10.2)
Chloride: 105 mmol/L (ref 96–106)
Creatinine, Ser: 1.74 mg/dL — ABNORMAL HIGH (ref 0.76–1.27)
GFR calc Af Amer: 53 mL/min/{1.73_m2} — ABNORMAL LOW (ref >59)
GFR calc non Af Amer: 46 mL/min/{1.73_m2} — ABNORMAL LOW (ref >59)
Globulin, Total: 3 g/dL (ref 1.5–4.5)
Glucose: 94 mg/dL (ref 65–99)
Potassium: 4.4 mmol/L (ref 3.5–5.2)
Sodium: 144 mmol/L (ref 134–144)
Total Protein: 7.1 g/dL (ref 6.0–8.5)

## 2018-05-03 LAB — CBC WITH DIFFERENTIAL/PLATELET
Basophils Absolute: 0 10*3/uL (ref 0.0–0.2)
Basos: 1 %
EOS (ABSOLUTE): 0.1 10*3/uL (ref 0.0–0.4)
Eos: 2 %
Hematocrit: 42.2 % (ref 37.5–51.0)
Hemoglobin: 14.3 g/dL (ref 13.0–17.7)
Immature Grans (Abs): 0 10*3/uL (ref 0.0–0.1)
Immature Granulocytes: 0 %
Lymphocytes Absolute: 1.5 10*3/uL (ref 0.7–3.1)
Lymphs: 27 %
MCH: 26.8 pg (ref 26.6–33.0)
MCHC: 33.9 g/dL (ref 31.5–35.7)
MCV: 79 fL (ref 79–97)
Monocytes Absolute: 0.5 10*3/uL (ref 0.1–0.9)
Monocytes: 9 %
Neutrophils Absolute: 3.3 10*3/uL (ref 1.4–7.0)
Neutrophils: 61 %
Platelets: 205 10*3/uL (ref 150–450)
RBC: 5.34 x10E6/uL (ref 4.14–5.80)
RDW: 15.1 % (ref 12.3–15.4)
WBC: 5.4 10*3/uL (ref 3.4–10.8)

## 2018-05-03 LAB — TSH: TSH: 1.06 u[IU]/mL (ref 0.450–4.500)

## 2018-05-09 ENCOUNTER — Encounter: Payer: Self-pay | Admitting: Family Medicine

## 2018-05-09 ENCOUNTER — Ambulatory Visit: Payer: BLUE CROSS/BLUE SHIELD | Admitting: Family Medicine

## 2018-05-09 VITALS — BP 182/112 | Ht 77.5 in | Wt 362.0 lb

## 2018-05-09 DIAGNOSIS — M25532 Pain in left wrist: Secondary | ICD-10-CM

## 2018-05-09 MED ORDER — METHYLPREDNISOLONE ACETATE 40 MG/ML IJ SUSP
40.0000 mg | Freq: Once | INTRAMUSCULAR | Status: AC
  Administered 2018-05-09: 40 mg via INTRA_ARTICULAR

## 2018-05-09 NOTE — Patient Instructions (Signed)
Ice the area 15 minutes at a time 3-4 times a day. It can take a few days for the injection to kick in, up to a week. Ok to take tylenol, use topical capsaicin or aspercreme if needed in meantime. Follow up with me in 1 month or as needed if you're doing well.

## 2018-05-10 ENCOUNTER — Encounter: Payer: Self-pay | Admitting: Family Medicine

## 2018-05-10 NOTE — Progress Notes (Signed)
PCP and consultation requested by: Magdalene River, PA-C  Subjective:   HPI: Patient is a 47 y.o. male here for left wrist pain.  Patient reports for about 1 month he's had off and on dorsal left wrist pain with swelling. No acute injury or trauma. Associated warmth, feels like prior gout flares he has had in feet. Pain level up to 7/10, sharp, worse with wrist motions. He gets flares infrequently - maybe yearly - much better since stopping HCTZ. No skin changes, numbness.  Past Medical History:  Diagnosis Date  . Hypertension     Current Outpatient Medications on File Prior to Visit  Medication Sig Dispense Refill  . acetaminophen (TYLENOL) 500 MG tablet Take 1 tablet (500 mg total) every 6 (six) hours as needed by mouth. 30 tablet 0  . amLODipine (NORVASC) 10 MG tablet Take 1 tablet (10 mg total) by mouth daily. 90 tablet 0  . lisinopril (PRINIVIL,ZESTRIL) 40 MG tablet Take 1 tablet (40 mg total) by mouth daily. 90 tablet 0  . metoprolol succinate (TOPROL-XL) 50 MG 24 hr tablet Take 1 tablet (50 mg total) by mouth daily. Take with or immediately following a meal. 90 tablet 0   No current facility-administered medications on file prior to visit.     History reviewed. No pertinent surgical history.  No Known Allergies  Social History   Socioeconomic History  . Marital status: Married    Spouse name: Not on file  . Number of children: Not on file  . Years of education: Not on file  . Highest education level: Not on file  Occupational History  . Occupation: Sports administrator: [employer]  Social Needs  . Financial resource strain: Not on file  . Food insecurity:    Worry: Not on file    Inability: Not on file  . Transportation needs:    Medical: Not on file    Non-medical: Not on file  Tobacco Use  . Smoking status: Never Smoker  . Smokeless tobacco: Never Used  Substance and Sexual Activity  . Alcohol use: No  . Drug use: No  . Sexual activity: Yes  Lifestyle   . Physical activity:    Days per week: Not on file    Minutes per session: Not on file  . Stress: Not on file  Relationships  . Social connections:    Talks on phone: Not on file    Gets together: Not on file    Attends religious service: Not on file    Active member of club or organization: Not on file    Attends meetings of clubs or organizations: Not on file    Relationship status: Not on file  . Intimate partner violence:    Fear of current or ex partner: Not on file    Emotionally abused: Not on file    Physically abused: Not on file    Forced sexual activity: Not on file  Other Topics Concern  . Not on file  Social History Narrative   Married with children. Education: [employer].     Family History  Problem Relation Age of Onset  . Hypertension Mother   . Diabetes Father   . Hypertension Father   . Hypertension Sister     BP (!) 182/112   Ht 6' 5.5" (1.969 m)   Wt (!) 362 lb (164.2 kg)   BMI 42.37 kg/m   Review of Systems: See HPI above.     Objective:  Physical Exam:  Gen: NAD, comfortable in exam room  Left wrist: Mild swelling dorsal wrist.  No bruising, other deformity. FROM with 5/5 strength all digits.  Mildly decreased extension and flexion of wrist due to pain Mild TTP dorsal wrist joint.  No snuffbox, other tenderness. NVI distally. Negative tinels and finkelsteins.  Right wrist: No deformity. FROM with 5/5 strength. No tenderness to palpation. NVI distally.   Assessment & Plan:  1. Left wrist pain - consistent with flare of acute gout flare of wrist.  Avoid NSAIDs and prolonged colchicine with his renal function, prednisone with his blood pressure.  Steroid injection given today after ultrasound used to identify joint - effusion not large enough to warrant aspiration but synovitis visualized as well.  Icing, tylenol, topical medications discussed.  F/u in 1 month or as needed.  After informed written consent patient was seated on exam table.   Area overlying left dorsal wrist joint prepped with alcohol swab then injected with 1:1 bupivicaine: depomedrol.  Patient tolerated procedure well without immediate complications.

## 2018-05-15 ENCOUNTER — Other Ambulatory Visit: Payer: Self-pay | Admitting: Physician Assistant

## 2018-05-15 ENCOUNTER — Encounter: Payer: Self-pay | Admitting: *Deleted

## 2018-05-15 MED ORDER — ATORVASTATIN CALCIUM 20 MG PO TABS: 20.0000 mg | ORAL_TABLET | Freq: Every day | ORAL | 3 refills | Status: DC

## 2018-05-15 NOTE — Progress Notes (Signed)
Meds ordered this encounter  Medications  . atorvastatin (LIPITOR) 20 MG tablet    Sig: Take 1 tablet (20 mg total) by mouth daily.    Dispense:  90 tablet    Refill:  3    Order Specific Question:   Supervising Provider    Answer:   SAGARDIA, MIGUEL JOSE [[id number]]    

## 2018-05-23 ENCOUNTER — Encounter: Payer: Self-pay | Admitting: Physician Assistant

## 2018-06-13 ENCOUNTER — Ambulatory Visit: Payer: BLUE CROSS/BLUE SHIELD | Admitting: Family Medicine

## 2018-06-13 ENCOUNTER — Encounter: Payer: Self-pay | Admitting: Family Medicine

## 2018-06-13 VITALS — BP 167/99 | Ht 78.0 in | Wt 362.0 lb

## 2018-06-13 DIAGNOSIS — G8929 Other chronic pain: Secondary | ICD-10-CM | POA: Diagnosis not present

## 2018-06-13 DIAGNOSIS — M25572 Pain in left ankle and joints of left foot: Secondary | ICD-10-CM

## 2018-06-13 DIAGNOSIS — M25532 Pain in left wrist: Secondary | ICD-10-CM

## 2018-06-13 NOTE — Assessment & Plan Note (Signed)
Concern for gout versus arthritis, still pain with active dorsiflexion, still using Tylenol for pain control she now status post injection no new injection plan

## 2018-06-13 NOTE — Patient Instructions (Signed)
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 Do calf raises and theraband strengthening 3 sets of 10 once a day. Arch supports are very important. Tylenol only if needed. Icing 15 minutes at a time 3-4 times a day as needed. Follow up with me in 6 weeks (or as needed if you're doing well). Consider custom orthotics.

## 2018-06-13 NOTE — Progress Notes (Signed)
   HPI Patient comes in for follow-up for wrist pain, injection was done on last visit there is concern for gout versus arthritis.  Pain is improved from 11\10 to 2\10 now.  Patient has been able to go to work and still with some minor discomfort, they are using Tylenol for pain control.  He feels he still has some limitation with dorsiflexion of hand but has good grip strength.  Patient also complaining of bilateral medial foot pain significantly worse in the left.  He thinks this is connected with falling arches, which has been going on since he fell in a hole years ago and he feels that his ankle has been swollen medially since.  He has no sensation deficits no new wounds, no recent changes in activity.  It is worse after he has been on his feet for long period of time, or when first starting to move around for the day.  CC: Left-sided ankle and foot pain   Medications/Interventions Tried: Tylenol, over-the-counter Dr. Margart SicklesScholl's orthotics  See HPI and/or previous note for associated ROS.  Objective: BP (!) 167/99   Ht 6\' 6"  (1.981 m)   Wt (!) 362 lb (164.2 kg)   BMI 41.83 kg/m  Gen: NAD, well groomed, a/o x3, normal affect.  obese CV: Well-perfused. Warm.  Resp: Non-labored.  Hand: Left hand with good grip strength, improved but still asymmetric swelling of his carpal bones dorsally.  Reduced dorsiflexion. Left foot: Pes planus.  Also with medial subluxation of the ankle.  No other deformity, swelling, bruising.  No distal deficits to sensation or motor control.  Moderate limitation IR with 4/5 strength.  Mild TTP course of post tib tendon.    Right foot/ankle: Pes planus; no ankle subluxation.   FROM with 5/5 strength. No tenderness to palpation. NVI distally.  Neuro: Sensation intact throughout. No gross coordination deficits.  Gait: Nonpathologic posture, unremarkable stride but with some signs of limp but no balance issues.   Assessment and plan:  Wrist pain, chronic,  left Concern for gout versus arthritis, still pain with active dorsiflexion, still using Tylenol for pain control she now status post injection no new injection plan  Pain of joint of left ankle and foot Base of exam appears to be medial subluxation of distal tibia, also with falling arches. Patient is given orthotics from the office, strengthening exercises, will continue ibuprofen.  We do not see utility for injection at this time return precautions discussed with patient   No orders of the defined types were placed in this encounter.   No orders of the defined types were placed in this encounter.   Marthenia RollingScott Xitlali Kastens, DO  PGY2 [place] 06/13/2018 3:16 PM

## 2018-06-13 NOTE — Assessment & Plan Note (Signed)
Base of exam appears to be medial subluxation of distal tibia, also with falling arches. Patient is given orthotics from the office, strengthening exercises, will continue ibuprofen.  We do not see utility for injection at this time return precautions discussed with patient

## 2018-06-14 ENCOUNTER — Encounter: Payer: Self-pay | Admitting: Family Medicine

## 2018-07-26 ENCOUNTER — Other Ambulatory Visit: Payer: Self-pay | Admitting: Physician Assistant

## 2018-07-26 DIAGNOSIS — I10 Essential (primary) hypertension: Secondary | ICD-10-CM

## 2018-08-01 ENCOUNTER — Other Ambulatory Visit: Payer: Self-pay | Admitting: Physician Assistant

## 2018-08-01 DIAGNOSIS — I10 Essential (primary) hypertension: Secondary | ICD-10-CM

## 2018-08-01 NOTE — Telephone Encounter (Signed)
Requested Prescriptions  Pending Prescriptions Disp Refills  . lisinopril (PRINIVIL,ZESTRIL) 40 MG tablet [Pharmacy Med Name: LISINOPRIL 40 MG TABLET] 90 tablet 1    Sig: TAKE 1 TABLET BY MOUTH EVERY DAY     Cardiovascular:  ACE Inhibitors Failed - 08/01/2018  1:05 AM      Failed - Cr in normal range and within 180 days    Creat  Date Value Ref Range Status  03/07/2016 1.67 (H) 0.60 - 1.35 mg/dL Final   Creatinine, Ser  Date Value Ref Range Status  05/02/2018 1.74 (H) 0.76 - 1.27 mg/dL Final         Failed - Last BP in normal range    BP Readings from Last 1 Encounters:  06/13/18 (!) 167/99         Passed - K in normal range and within 180 days    Potassium  Date Value Ref Range Status  05/02/2018 4.4 3.5 - 5.2 mmol/L Final         Passed - Patient is not pregnant      Passed - Valid encounter within last 6 months    Recent Outpatient Visits          3 months ago Essential hypertension   [facility] [place], [place] D, PA-C   1 year ago Chronic pain of left ankle   [facility], [place] D, PA-C   1 year ago Essential hypertension   [facility] [place], [place] D, PA-C   1 year ago Essential hypertension   [facility] [place], [place] D, PA-C   2 years ago Obesity, Class III, BMI 40-49.9 (morbid obesity) (HCC)   [facility] [place], Asencion Partridge, MD

## 2018-08-23 ENCOUNTER — Encounter: Payer: Self-pay | Admitting: Internal Medicine

## 2018-08-23 ENCOUNTER — Ambulatory Visit: Payer: BLUE CROSS/BLUE SHIELD | Admitting: Internal Medicine

## 2018-08-23 VITALS — BP 140/82 | HR 70 | Ht 78.0 in | Wt 347.6 lb

## 2018-08-23 DIAGNOSIS — Z8739 Personal history of other diseases of the musculoskeletal system and connective tissue: Secondary | ICD-10-CM

## 2018-08-23 DIAGNOSIS — N183 Chronic kidney disease, stage 3 unspecified: Secondary | ICD-10-CM

## 2018-08-23 DIAGNOSIS — G4733 Obstructive sleep apnea (adult) (pediatric): Secondary | ICD-10-CM

## 2018-08-23 DIAGNOSIS — R7303 Prediabetes: Secondary | ICD-10-CM

## 2018-08-23 DIAGNOSIS — I10 Essential (primary) hypertension: Secondary | ICD-10-CM | POA: Diagnosis not present

## 2018-08-23 MED ORDER — CARVEDILOL 12.5 MG PO TABS: 12.5000 mg | ORAL_TABLET | Freq: Two times a day (BID) | ORAL | 0 refills | Status: DC

## 2018-08-23 NOTE — Progress Notes (Signed)
Cardiology Office Note:    Date:  08/23/2018   ID:  Derrick Cain., DOB [birth year], MRN [id number]  PCP:  Magdalene River, PA-C  Cardiologist:  No primary care provider on file.  Electrophysiologist:  None   Referring MD: Benjiman Core D, PA*   Essential hypertension and chronic renal insufficiency  History of Present Illness:    Derrick Cain. is a 48 y.o. male with a hx of hypertension, chronic renal insufficiency, gout, and obesity.  He presents today for evaluation of hypertension.  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  He follows with Dr. Elvis Coil in nephrology and was last seen yesterday.  I have spoken to Dr. Hyman Hopes by phone during the patient's visit today and we have reviewed the patient's antihypertensive regimen in conjunction with the patient.  [name] is largely asymptomatic from a cardiopulmonary standpoint.  He is compliant with his CPAP as often as he can be.  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 The patient denies chest pain, chest pressure, dyspnea at rest or with exertion, palpitations, PND, orthopnea, or significant leg swelling. Denies syncope or presyncope. Denies dizziness or lightheadedness.  Endorses snoring and is treated for sleep apnea.  He denies unintentional weight loss, but has successfully lost 30 pounds in an effort to improve his health and address his pre-diabetes.  I have commended him for his efforts.  His current medication regimen confirmed with the patient today is amlodipine 10 mg daily, atorvastatin 20 mg daily, lisinopril 40 mg once daily, and metoprolol succinate 50 mg once daily.  I have asked the patient if he takes this once or twice daily and he tells me he takes it once daily.  Past Medical History:  Diagnosis Date  . Hypertension     No past surgical history on file.  Current Medications: Current Meds  Medication Sig  . acetaminophen (TYLENOL) 500 MG tablet  Take 1 tablet (500 mg total) every 6 (six) hours as needed by mouth.  Marland Kitchen amLODipine (NORVASC) 10 MG tablet TAKE 1 TABLET BY MOUTH EVERY DAY  . atorvastatin (LIPITOR) 20 MG tablet Take 1 tablet (20 mg total) by mouth daily.  Marland Kitchen lisinopril (PRINIVIL,ZESTRIL) 40 MG tablet TAKE 1 TABLET BY MOUTH EVERY DAY  . [DISCONTINUED] metoprolol succinate (TOPROL-XL) 50 MG 24 hr tablet TAKE 1 TABLET (50 MG TOTAL) BY MOUTH DAILY. TAKE WITH OR IMMEDIATELY FOLLOWING A MEAL.     Allergies:   Patient has no known allergies.   Social History   Socioeconomic History  . Marital status: Married    Spouse name: Not on file  . Number of children: Not on file  . Years of education: Not on file  . Highest education level: Not on file  Occupational History  . Occupation: Sports administrator: [employer]  Social Needs  . Financial resource strain: Not on file  . Food insecurity:    Worry: Not on file    Inability: Not on file  . Transportation needs:    Medical: Not on file    Non-medical: Not on file  Tobacco Use  . Smoking status: Never Smoker  . Smokeless tobacco: Never Used  Substance and Sexual Activity  . Alcohol use: No  . Drug use: No  . Sexual activity: Yes  Lifestyle  . Physical activity:    Days per week: Not on file    Minutes per session: Not on  file  . Stress: Not on file  Relationships  . Social connections:    Talks on phone: Not on file    Gets together: Not on file    Attends religious service: Not on file    Active member of club or organization: Not on file    Attends meetings of clubs or organizations: Not on file    Relationship status: Not on file  Other Topics Concern  . Not on file  Social History Narrative   Married with children. Education: [employer].      Family History: The patient's family history includes Diabetes in his father; Hypertension in his father, mother, and sister.  ROS:   Please see the history of present illness.    All other systems reviewed and are  negative.  EKGs/Labs/Other Studies Reviewed:    The following studies were reviewed today:  EKG: Normal sinus rhythm, incomplete right bundle branch block, ventricular rate 70 beats minute.  Recent Labs: 05/02/2018: ALT 33; BUN 14; Creatinine, Ser 1.74; Hemoglobin 14.3; Platelets 205; Potassium 4.4; Sodium 144; TSH 1.060  Recent Lipid Panel    Component Value Date/Time   CHOL 162 05/02/2018 1635   TRIG 109 05/02/2018 1635   HDL 32 (L) 05/02/2018 1635   CHOLHDL 5.1 (H) 05/02/2018 1635   CHOLHDL 5.1 (H) 04/11/2016 1017   VLDL 26 04/11/2016 1017   LDLCALC 108 (H) 05/02/2018 1635    Physical Exam:    VS:  BP 140/82   Pulse 70   Ht 6\' 6"  (1.981 m)   Wt (!) 347 lb 9.6 oz (157.7 kg)   BMI 40.17 kg/m     Wt Readings from Last 3 Encounters:  08/23/18 (!) 347 lb 9.6 oz (157.7 kg)  06/13/18 (!) 362 lb (164.2 kg)  05/09/18 (!) 362 lb (164.2 kg)     Constitutional: No acute distress ENMT: normal dentition, moist mucous membranes Cardiovascular: regular rhythm, normal rate, no murmurs. S1 and S2 normal. Radial pulses normal bilaterally. No jugular venous distention.  Respiratory: clear to auscultation bilaterally GI : normal bowel sounds, soft and nontender. No distention.   MSK: extremities warm, well perfused. No edema.  NEURO: grossly nonfocal exam, moves all extremities. PSYCH: alert and oriented x 3, normal mood and affect.   ASSESSMENT:    1. Essential hypertension   2. CKD (chronic kidney disease) stage 3, GFR 30-59 ml/min (HCC)   3. Severe obstructive sleep apnea   4. History of gout   5. Prediabetes   6. Obesity, Class III, BMI 40-49.9 (morbid obesity) (HCC)    PLAN:    I discussed medication changes with his nephrologist Dr. Hyman Hopes during today's visit.  We agree that a transition to Coreg would be beneficial for the alpha blockade component of this medication, and we will transition to carvedilol 12.5 mg twice daily and discontinue his metoprolol succinate 50 mg  daily.  I have reviewed this dosing with our pharmacist in office.  He will report his 2 weeks of blood pressures after transitioning to Coreg to our office after which if his blood pressure remains above goal, we will consider initiating doxazosin with plan for titration as needed.  I would consider starting with a dose of 2 mg daily and monitoring for response.  I have offered an echocardiogram to the patient today to evaluate for degree of left ventricular hypertrophy in the setting of longstanding hypertension, we have participated in shared decision making and determined that this can be performed at a follow-up  visit.  I would like to see the patient back in follow-up in approximately 3 months time which times up with his nephrology follow-up.  We have discussed exercise recommendations and dietary modifications for optimal cardiovascular health. Medication Adjustments/Labs and Tests Ordered: Current medicines are reviewed at length with the patient today.  Concerns regarding medicines are outlined above.  No orders of the defined types were placed in this encounter.  Meds ordered this encounter  Medications  . carvedilol (COREG) 12.5 MG tablet    Sig: Take 1 tablet (12.5 mg total) by mouth 2 (two) times daily.    Dispense:  180 tablet    Refill:  0    D/C METOPROLOL    Patient Instructions  Medication Instructions:  STOP METOPROLOL   START CARVEDILOL 12.5 MG TWICE  A DAY   If you need a refill on your cardiac medications before your next appointment, please call your pharmacy.   Lab work: NONE  Testing/Procedures: NONE  Follow-Up: At [facility], you and your health needs are our priority.  As part of our continuing mission to provide you with exceptional heart care, we have created designated Provider Care Teams.  These Care Teams include your primary Cardiologist (physician) and Advanced Practice Providers (APPs -  Physician Assistants and Nurse Practitioners) who all  work together to provide you with the care you need, when you need it. You will need a follow up appointment in 3 months. You may see DR Jacques NavyACHARYA  or one of the following Advanced Practice Providers on your designated Care Team:   Corine ShelterLuke Kilroy, PA-C [place], [place] . Marjie Skiffallie Goodrich, PA-C  Any Other Special Instructions Will Be Listed Below (If Applicable). MONITOR AND LOG YOUR BLOOD PRESSURE AND HEART RATE. CALL THE OFFICE IN 2 WEEKS WITH READINGS [phone]     Signed, Parke PoissonGayatri A Ahlia Lemanski, MD  08/23/2018 9:11 AM    [facility]

## 2018-08-23 NOTE — Patient Instructions (Signed)
Medication Instructions:  STOP METOPROLOL   START CARVEDILOL 12.5 MG TWICE  A DAY   If you need a refill on your cardiac medications before your next appointment, please call your pharmacy.   Lab work: NONE  Testing/Procedures: NONE  Follow-Up: At [facility], you and your health needs are our priority.  As part of our continuing mission to provide you with exceptional heart care, we have created designated Provider Care Teams.  These Care Teams include your primary Cardiologist (physician) and Advanced Practice Providers (APPs -  Physician Assistants and Nurse Practitioners) who all work together to provide you with the care you need, when you need it. You will need a follow up appointment in 3 months. You may see DR Jacques Navy  or one of the following Advanced Practice Providers on your designated Care Team:   Corine Shelter, PA-C [place], [place] . Marjie Skiff, PA-C  Any Other Special Instructions Will Be Listed Below (If Applicable). MONITOR AND LOG YOUR BLOOD PRESSURE AND HEART RATE. CALL THE OFFICE IN 2 WEEKS WITH READINGS [phone]

## 2018-08-27 ENCOUNTER — Encounter: Payer: Self-pay | Admitting: Adult Health

## 2018-08-28 ENCOUNTER — Encounter: Payer: Self-pay | Admitting: Neurology

## 2018-08-28 ENCOUNTER — Ambulatory Visit: Payer: BLUE CROSS/BLUE SHIELD | Admitting: Neurology

## 2018-08-28 VITALS — BP 194/108 | HR 73 | Ht 78.0 in | Wt 347.0 lb

## 2018-08-28 DIAGNOSIS — Z8739 Personal history of other diseases of the musculoskeletal system and connective tissue: Secondary | ICD-10-CM

## 2018-08-28 DIAGNOSIS — G4733 Obstructive sleep apnea (adult) (pediatric): Secondary | ICD-10-CM | POA: Diagnosis not present

## 2018-08-28 DIAGNOSIS — I10 Essential (primary) hypertension: Secondary | ICD-10-CM | POA: Diagnosis not present

## 2018-08-28 DIAGNOSIS — Z9989 Dependence on other enabling machines and devices: Secondary | ICD-10-CM

## 2018-08-28 DIAGNOSIS — G4726 Circadian rhythm sleep disorder, shift work type: Secondary | ICD-10-CM | POA: Insufficient documentation

## 2018-08-28 NOTE — Patient Instructions (Signed)

## 2018-08-28 NOTE — Progress Notes (Signed)
SLEEP MEDICINE CLINIC   Provider:  Melvyn Novas, M D  Primary Care Physician:  Magdalene River, PA-C   Referring Provider: Magdalene River, PA*    Chief Complaint  Patient presents with  . Follow-up    pt alone, rm 11. pt states that CPAP is working well and he can tell a difference, better sleep quality, more alertness. DME [facility]    HPI:  Derrick Cain. is a 48 y.o. male patient , seen in a RV on 08-28-2018.  Derrick Cain presented today with very high BP, just changed to Carvedilol from Metoprolol. His nephrologist is Dr.Webb,Cardiologist is Dr. Jacques Navy. No headaches, no report of faintness or lightheadedness.  The patient was seen here for CPAP compliance visit he is using a autotitrator with a minimum pressure of 6 maximum pressure of 15 cmH2O and 3 cm EPR, he has used the machine 97% of days but only 40% of those that he actually use it over 4 hours.  His average user time is only 3 hours 56 minutes.  His residual AHI is 4.5, most apneas are obstructive in nature, his 95th percentile is 12.2 cmH2O.  He does not have major air leakage and he endorsed an Epworth sleepiness score of only 5 points fatigue severity at 29 points  Based on these data I think we need to increase his maximum pressure by 2 cmH2O I would also like for him to try to get to the 4-hour mark each night.  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  I have the pleasure of meeting today with Derrick Cain who underwent an attended split-night titration study on 11 October last year when he was diagnosed with very severe apnea at 97.0 AHI, did not enter any ring REM sleep and there was no positional component.  He did retain CO2, he had prolonged hypoxemia for 70 minutes with a nadir at 70% SPO2.  He was titrated in the split-night to 15 cm water pressure on CPAP but did not benefit much from it.  He was switched to  BiPAP and this was also not a full night titration due to time restraints.  Overall he returned for a full night titration on 01 November 2017 and he responded very well to CPAP at 7 cmH2O, he was given an auto titration capable CPAP machine.  He is using a medium size nasal pillow difficult air fit P 30 I.  He describes the interface is very comfortable and he feels that his CPAP treatment has already given him the benefit of sound asleep and more alertness in daytime.  Compliance for the third shift worker was 29 out of 30 days but only 16 days over 4 hours of nocturnal use.  The month before have looked much better as he did not have the same work schedule.  His average usage is 4 hours and 2 minutes for the last 30 days, set pressure is 7.4 cmH2O.   He does have a lot of obstructive apneas that are still persisting.  Air leakage is not very high and no central apneas have emerged. He has sometimes wiped the nosepiece off during sleep.  The residual AHI was very high 18.6  /h (!) , pure Apnea index was 12.1/ h. but he is not longer snoring according to his wife.  Based on this I will  place him on an AutoSet and open the pressure window between 5 and 15 cmH2O with 3 cm EPR.  I was also concerned about hypercapnia and hypoxemia resolution.  Will order pulse-oximetry while on CPAP.    Originally  seen here as in a referral  from PA [place] for a sleep consultation, Derrick Cain is a 39 year old married father of 4, who reports having a history of difficult to control hypertension for the last 2 or 3 years. He also has gained some weight. His children and his wife have reported that he stops breathing in his sleep and commented on his loud snoring. PA Barnett Cain  just added a third medication for hypertension control, a beta blocker. Morning blood pressure today was 155/108 mmHg. His weight today was 366 pounds.The patient is a third shift worker  ( 8 hours from 9 PM and 6 AM), which adds an additional  dimension. His diet has been adjusted, he does not add salt to food, he avoids canned food and salty snacks, he and his wife are walking for exercise.  The patient has been followed by cardiology, had a cardiac stress test which returned normal. The patient also has seen nephrology, Dr. Elvis Coil due to elevated creatinine levels.  Chief complaint according to patient : " I need to get my BP under control "  Sleep habits are as follows: Derrick Cain comes home after work at about 6:30 AM, takes a shower, a small meal, and goes to bed by 7.30. He is capable of sleeping 8 hours in daytime. Lights, sounds or the room temperature do not interfere with that. He usually prepare a meal around 1 PM for himself and the children, on school days he can sleep until 3 and picks the children up at 4 Pm from school.  Then he stays awake-and will go back to bed between 6 PM until  8 PM for another 2 hours.  He is daytime sleepy- he fell asleep here during our interview, twice. His mouth is parched, he snored in a seated position.   Sleep medical history and family sleep history:  Father had Apnea and HTN and DM,  3 of mother's sisters have diabetes.  Social history: shift worker - third shift for 15 years , Children 98, 43, 17 and 44 years old. Non smoker, non drinker, caffeine: soft drinks- 4 a day. He avoids coffee or iced tea hot tea with caffeine. He drinks [facility].  Review of Systems: Out of a complete 14 system review, the patient complains of only the following symptoms, and all other reviewed systems are negative.  The patient reports ankle swelling, sometimes trouble swallowing congestion runny nose and allergies, snoring, excessive daytime sleepiness, hypertension, high creatinine Epworth score 20/24  , Fatigue severity score 30  ,  How likely are you to doze in the following situations: 0 = not likely, 1 = slight chance, 2 = moderate chance, 3 = high chance  Sitting and Reading? 3 Watching  Television?3 Sitting inactive in a public place (theater or meeting)?3 As a passenger in a car for an hour without a break?3 Lying down in the afternoon when circumstances permit?2 Sitting and talking to someone?2 Sitting quietly after lunch without alcohol?2 In a car, while stopped for a few minutes in traffic?2   Total =20- high risk of dozing.    Social History   Socioeconomic History  . Marital status: Married    Spouse name: Not on file  . Number of  children: Not on file  . Years of education: Not on file  . Highest education level: Not on file  Occupational History  . Occupation: Sports administratorMechanic    Employer: [employer]  Social Needs  . Financial resource strain: Not on file  . Food insecurity:    Worry: Not on file    Inability: Not on file  . Transportation needs:    Medical: Not on file    Non-medical: Not on file  Tobacco Use  . Smoking status: Never Smoker  . Smokeless tobacco: Never Used  Substance and Sexual Activity  . Alcohol use: No  . Drug use: No  . Sexual activity: Yes  Lifestyle  . Physical activity:    Days per week: Not on file    Minutes per session: Not on file  . Stress: Not on file  Relationships  . Social connections:    Talks on phone: Not on file    Gets together: Not on file    Attends religious service: Not on file    Active member of club or organization: Not on file    Attends meetings of clubs or organizations: Not on file    Relationship status: Not on file  . Intimate partner violence:    Fear of current or ex partner: Not on file    Emotionally abused: Not on file    Physically abused: Not on file    Forced sexual activity: Not on file  Other Topics Concern  . Not on file  Social History Narrative   Married with children. Education: [employer].     Family History  Problem Relation Age of Onset  . Hypertension Mother   . Diabetes Father   . Hypertension Father   . Hypertension Sister     Past Medical History:  Diagnosis Date  .  Hypertension     No past surgical history on file.  Current Outpatient Medications  Medication Sig Dispense Refill  . acetaminophen (TYLENOL) 500 MG tablet Take 1 tablet (500 mg total) every 6 (six) hours as needed by mouth. 30 tablet 0  . amLODipine (NORVASC) 10 MG tablet TAKE 1 TABLET BY MOUTH EVERY DAY 90 tablet 0  . atorvastatin (LIPITOR) 20 MG tablet Take 1 tablet (20 mg total) by mouth daily. 90 tablet 3  . carvedilol (COREG) 12.5 MG tablet Take 1 tablet (12.5 mg total) by mouth 2 (two) times daily. 180 tablet 0  . lisinopril (PRINIVIL,ZESTRIL) 40 MG tablet TAKE 1 TABLET BY MOUTH EVERY DAY 90 tablet 0   No current facility-administered medications for this visit.     Allergies as of 08/28/2018  . (No Known Allergies)    Vitals: BP (!) 194/108 Comment: pt just started a new BP medication  Pulse 73   Ht 6\' 6"  (1.981 m)   Wt (!) 347 lb (157.4 kg)   BMI 40.10 kg/m  Last Weight:  Wt Readings from Last 1 Encounters:  08/28/18 (!) 347 lb (157.4 kg)   ZOX:WRUEBMI:Body mass index is 40.1 kg/m.     Last Height:   Ht Readings from Last 1 Encounters:  08/28/18 6\' 6"  (1.981 m)    Physical exam:  General: The patient is awake, alert and appears not in acute distress. The patient is well groomed. Head: Normocephalic, atraumatic. Neck is supple. Mallampati 5,  neck circumference:21. Nasal airflow patent . Retrognathia is seen. Used braces in HS.  Cardiovascular:  Regular rate and rhythm , without  murmurs or carotid bruit, and without distended  neck veins. Respiratory: Lungs are clear to auscultation. Skin:  Without evidence of edema, or rash Trunk: BMI is 43 . The patient's posture is erect  Neurologic exam : The patient is drowsy, oriented to place and time.    Speech is fluent,  without dysarthria, dysphonia or aphasia.  Mood and affect are appropriate.  Cranial nerves: Pupils are equal and briskly reactive to light. Funduscopic exam without evidence of pallor or edema.    Extraocular movements  in vertical and horizontal planes intact and without nystagmus. Visual fields by finger perimetry are intact. Hearing to finger rub intact.  Facial sensation intact to fine touch. Facial motor strength is symmetric and tongue and uvula move midline. Shoulder shrug was symmetrical.   Motor exam:  Normal tone, muscle bulk and symmetric strength in all extremities. Sensory:  Fine touch, pinprick and vibration were tested in all extremities.  Coordination:Finger-to-nose maneuver  normal without evidence of ataxia, dysmetria or tremor.  Gait and station: Patient walks without assistive device . Strength within normal limits.  Stance is stable and normal.  Turns with 3 Steps.  Deep tendon reflexes: in the  upper and lower extremities are symmetric but attenuated.    Assessment:  After physical and neurologic examination, review of laboratory studies,  Personal review of imaging studies, reports of other /same  Imaging studies, results of polysomnography and / or neurophysiology testing and pre-existing records as far as provided in visit., my assessment is   1)  OSA : treated sufficiently with auto CPAP. He does not have major air leakage and he endorsed an Epworth sleepiness score of only 5 points fatigue severity at 29 points  Based on these data I think we need to increase his maximum pressure by 2 cmH2O I would also like for him to try to get to the 4-hour mark each night.  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  He has been semi compliant with his CPAP machine , could add nap time to user time.    2) Circadian Rhythm Disorders. Continued third shift work.  The patient was advised of the nature of the diagnosed disorder , the treatment options and the  risks for general health and wellness arising from not treating the condition.   His HTN may improve within a 10 day period on the new medication, and he took decongestants.   I spent more than 20  minutes of face to face time with the patient. Greater than 50% of time was spent in counseling and coordination of care. We have discussed the diagnosis and differential and I answered the patient's questions.    RV in 12  Month with NP.    Compliance to CPAP and Epworth to be documented.  Melvyn NovasARMEN Kazi Montoro, MD 08/28/2018, 1:49 PM  Certified in Neurology by ABPN Certified in Sleep Medicine by [facility]  [facility] [address], [address] [place], [place] [address]

## 2018-08-29 ENCOUNTER — Encounter: Payer: Self-pay | Admitting: Physician Assistant

## 2018-08-30 ENCOUNTER — Telehealth: Payer: Self-pay

## 2018-08-30 NOTE — Telephone Encounter (Signed)
Called pt after receiving his mychart message regarding his elevated BP. Pt was switched from Metoprolol 50mg  to Carvedilol 12.5mg  bid on 08/27/18. Pt reports that he has been checking his BP daily and it has been consistently elevated since the switch. Adv pt that it may take a couple of days to see a change in his BP. His BP this morning 170/103 82bpm 1-2 hours after taking his morning medications. Pt is asymptomatic and feels that he is tolerating Carvedilol, his concern is that his BP remains elevated. Adv pt that Dr.Acharya's plan is to titrate the medication. Adv pt that I will fwd her an update and call back with her recommendation. Adv pt to seek emergency if symptoms develop, especially stroke like. Pt voiced understanding.

## 2018-08-30 NOTE — Telephone Encounter (Signed)
Please have him start doxazosin 2 mg daily.   Please arrange a pharmacy HTN visit in 1-2 weeks so they an uptitrate his meds at the visit. Can go up on carvedilol to 25 mg BID at pharmacy visit, and can consider increasing doxazosin.

## 2018-08-30 NOTE — Telephone Encounter (Signed)
Spoke with pt and adv him of Dr.Achaya's recommendation. Adv pt that a scheduler will call him to schedule a 1-2 week  appt with the Pharmacist. Adv pt to bring his BP machine, BP log, list of current medications with him to that appt. Pt is to contact the office sooner if symptoms develop. Pt is agreeable with the plan and voiced appreciation for the call back.

## 2018-09-03 NOTE — Telephone Encounter (Signed)
See 08/30/18 tel encounter

## 2018-09-18 ENCOUNTER — Ambulatory Visit (INDEPENDENT_AMBULATORY_CARE_PROVIDER_SITE_OTHER): Payer: BLUE CROSS/BLUE SHIELD | Admitting: Pharmacist

## 2018-09-18 VITALS — BP 188/100 | HR 67 | Resp 16 | Ht 78.0 in | Wt 344.6 lb

## 2018-09-18 DIAGNOSIS — I10 Essential (primary) hypertension: Secondary | ICD-10-CM

## 2018-09-18 MED ORDER — CARVEDILOL 25 MG PO TABS: 25.0000 mg | ORAL_TABLET | Freq: Two times a day (BID) | ORAL | 0 refills | Status: DC

## 2018-09-18 NOTE — Patient Instructions (Addendum)
Return for a  follow up appointment in 3 weeks  Check your blood pressure at home daily (if able) and keep record of the readings.  Take your BP meds as follows: *INCREASE carvedilol to 25mg  twice daily (okay to take 2 tablets of 12.5mg  twice daily until gone)* *Keep all other medication as previously prescribed*  Bring all of your meds, your BP cuff and your record of home blood pressures to your next appointment.  Exercise as you're able, try to walk approximately 30 minutes per day.  Keep salt intake to a minimum, especially watch canned and prepared boxed foods.  Eat more fresh fruits and vegetables and fewer canned items.  Avoid eating in fast food restaurants.    HOW TO TAKE YOUR BLOOD PRESSURE: . Rest 5 minutes before taking your blood pressure. .  Don't smoke or drink caffeinated beverages for at least 30 minutes before. . Take your blood pressure before (not after) you eat. . Sit comfortably with your back supported and both feet on the floor (don't cross your legs). . Elevate your arm to heart level on a table or a desk. . Use the proper sized cuff. It should fit smoothly and snugly around your bare upper arm. There should be enough room to slip a fingertip under the cuff. The bottom edge of the cuff should be 1 inch above the crease of the elbow. . Ideally, take 3 measurements at one sitting and record the average.

## 2018-09-18 NOTE — Progress Notes (Signed)
Patient ID: Derrick Cain.                 DOB: [birth year]                      MRN: [id number]     HPI: Derrick Cain. is a 49 y.o. male referred by Dr. Jacques Navy to HTN clinic.  PMH includes CKD under nephrologist management, OSA on CPAP,uncontrolled hypertension for > 9 years, hyperlipidemia, pre-diabetes, gout, and obesity. Patient is currently working on weight management and reports > 30lbs weight loss. Denies dizziness, lightheadedness, swelling, shortness of breath, blurry vision, increased fatigue or chest pain. Reports compliance with all medication as well. Noted BP remains elevated but less fluctuation since change from metoprolol to carvedilol.   Current HTN meds:  Amlodipine 10mg  daily Carvedilol 12.5mg  twice daily Lisinopril 40mg  daily   Previously tried:  Metoprolol succinate 50mg  daily  BP goal: <130/80  Family History: The patient's family history includes Diabetes in his father; Hypertension in his father, mother, and sister  Social History: Denies alcohol or tobacco use  Diet: working on less sodium and lowe caloric intake (30 lbs weigh loss so far)  Exercise: activities of daily living  Home BP readings:  10 readings; average 175/106; HR 63-79bpm  Wt Readings from Last 3 Encounters:  09/18/18 (!) 344 lb 9.6 oz (156.3 kg)  08/28/18 (!) 347 lb (157.4 kg)  08/23/18 (!) 347 lb 9.6 oz (157.7 kg)   BP Readings from Last 3 Encounters:  09/18/18 (!) 188/100  08/28/18 (!) 194/108  08/23/18 140/82   Pulse Readings from Last 3 Encounters:  09/18/18 67  08/28/18 73  08/23/18 70     Past Medical History:  Diagnosis Date  . Hypertension     Current Outpatient Medications on File Prior to Visit  Medication Sig Dispense Refill  . acetaminophen (TYLENOL) 500 MG tablet Take 1 tablet (500 mg total) every 6 (six) hours as needed by mouth. 30 tablet 0  . amLODipine (NORVASC) 10 MG tablet TAKE 1 TABLET BY MOUTH EVERY DAY 90 tablet 0  . atorvastatin (LIPITOR) 20 MG  tablet Take 1 tablet (20 mg total) by mouth daily. 90 tablet 3  . lisinopril (PRINIVIL,ZESTRIL) 40 MG tablet TAKE 1 TABLET BY MOUTH EVERY DAY 90 tablet 0   No current facility-administered medications on file prior to visit.     No Known Allergies  Blood pressure (!) 188/100, pulse 67, resp. rate 16, height 6\' 6"  (1.981 m), weight (!) 344 lb 9.6 oz (156.3 kg), SpO2 97 %.  HTN (hypertension) Blood pressure remains well above desired goal range but slightly improved since change from metoprolol succinate to carvedilol. Noted hx of CKD managed by nephrologist. Patient already on amlodipine and lisinopril max daily dose. Per nephrologist recommendations we will avoid diuretic for now.  Also noted less BP fluctuation since taking carvedilol twice daily.   Will increase carvedilol dose to 25mg  twice daily, continue all other therapy as previously prescribed and follow up in 3-4 weeks. Plan to increase carvedilol to 37.5mg  twice daily if addiotional BP control needed and HR stable. May add doxazosin 4mg  qHS as an alternative if HR not appropriate for further BB titration.   Gaile Allmon Rodriguez-Guzman PharmD, BCPS, CPP [facility] [address] [place] [address] 09/21/2018 8:55 AM

## 2018-09-21 ENCOUNTER — Encounter: Payer: Self-pay | Admitting: Pharmacist

## 2018-09-21 NOTE — Assessment & Plan Note (Signed)
Blood pressure remains well above desired goal range but slightly improved since change from metoprolol succinate to carvedilol. Noted hx of CKD managed by nephrologist. Patient already on amlodipine and lisinopril max daily dose. Per nephrologist recommendations we will avoid diuretic for now.  Also noted less BP fluctuation since taking carvedilol twice daily.   Will increase carvedilol dose to 25mg  twice daily, continue all other therapy as previously prescribed and follow up in 3-4 weeks. Plan to increase carvedilol to 37.5mg  twice daily if addiotional BP control needed and HR stable. May add doxazosin 4mg  qHS as an alternative if HR not appropriate for further BB titration.

## 2018-10-04 ENCOUNTER — Telehealth: Payer: Self-pay | Admitting: Pharmacist

## 2018-10-04 NOTE — Telephone Encounter (Signed)
LMOM; patient to call back to discuss plan for follow up.  Appt for HTN clinic on 10/09/2018 cancelled. Will re-schedule as virtual visit or tele-visit ASAP.

## 2018-10-09 ENCOUNTER — Ambulatory Visit: Payer: BLUE CROSS/BLUE SHIELD

## 2018-10-22 ENCOUNTER — Other Ambulatory Visit: Payer: Self-pay | Admitting: Physician Assistant

## 2018-10-22 DIAGNOSIS — I10 Essential (primary) hypertension: Secondary | ICD-10-CM

## 2018-10-26 ENCOUNTER — Other Ambulatory Visit: Payer: Self-pay | Admitting: Physician Assistant

## 2018-10-26 DIAGNOSIS — I10 Essential (primary) hypertension: Secondary | ICD-10-CM

## 2018-11-02 ENCOUNTER — Telehealth: Payer: Self-pay | Admitting: *Deleted

## 2018-11-02 NOTE — Telephone Encounter (Signed)
lmtcb - need to schedule virtual visit with dr Jacques Navy for 5/13

## 2018-11-06 ENCOUNTER — Telehealth: Payer: Self-pay | Admitting: Pharmacist Clinician (PhC)/ Clinical Pharmacy Specialist

## 2018-11-06 NOTE — Telephone Encounter (Signed)
Spoke with patient as follow up to hypertension visit from March 10.  Patient has history of uncontrolled hypertension, as well as obesity and OSA (on cpap).    Spent approximately 15 minutes on the phone with the patient today.    When he saw Raquel in March his blood pressure was 188/100.  He was taking amlodipine 10 mg, lisinopril 40 mg, both in the mornings and carvedilol 12.5 mg twice daily.  His carvedilol was increased to 25 mg bid at that time.    Labs  04/2018:  Na 144, K 4.4, Glu 94, BUN 14, SCr 1.74  Patient notes that since increasing the carvedilol to 25 mg bid his home blood pressures have dropped significantly.  With the exception of this morning, when his pressure was 153/104, he seems to be averaging home readings 140-155/80-90.  He usually checks his pressure mid-day.  He works 3rd shift, so will take his medications in the mornings around 7-8 am when he gets home from work, then go to bed.  He will check his pressure in mid-afternoon after getting up, then take his evening carvedilol dose around 7-8 pm on his way out.  He has both wrist and arm blood pressure cuffs, although he notes that the arm cuff is a bit small and will sometimes read error if he doesn't place it just right.  He does note that when it works, both cuffs read within 5-10 points of each other.  His cuff is by [employer] and we will look to see if they make a large cuff to use with the machine.    He continues to try eating well and avoiding salt.  He has 3 teenagers at home during this pandemic, and is hoping to get out and start bicycling with them in the next few days.   For now, I am going to have him switch the amlodipine to evenings with his second dose of carvedilol.  He will probably need the addition of another medication (hydralazine most likely due to his kidney function), but wanted to give him a chance to maximize the benefit of his current medications.  He seemed to have an incredible drop in pressure just  with increasing the carvedilol, so will give this a try.  We will reach out to him in 3-4 weeks to touch base and see if he has noted any improvement

## 2018-11-14 ENCOUNTER — Other Ambulatory Visit: Payer: Self-pay | Admitting: Internal Medicine

## 2018-11-21 ENCOUNTER — Ambulatory Visit: Payer: BLUE CROSS/BLUE SHIELD | Admitting: Internal Medicine

## 2018-11-21 ENCOUNTER — Telehealth: Payer: Self-pay | Admitting: *Deleted

## 2018-11-21 NOTE — Telephone Encounter (Signed)
   TELEPHONE CALL NOTE  This patient has been deemed a candidate for follow-up tele-health visit to limit community exposure during the Covid-19 pandemic. I spoke with the patient via phone to discuss instructions.  The patient will receive a phone call 2-3 days prior to their E-Visit at which time consent will be verbally confirmed.   A Virtual Office Visit appointment type has been scheduled for 5/15  with [facility], with "VIDEO/TEXT   type.  I have  confirmed the patient is active in MyChart.  Tobin Chad, RN 11/21/2018 3:50 PM

## 2018-11-22 ENCOUNTER — Other Ambulatory Visit: Payer: Self-pay

## 2018-11-22 ENCOUNTER — Telehealth: Payer: Self-pay | Admitting: Internal Medicine

## 2018-11-22 MED ORDER — CARVEDILOL 25 MG PO TABS: 25.0000 mg | ORAL_TABLET | Freq: Two times a day (BID) | ORAL | 2 refills | Status: DC

## 2018-11-22 NOTE — Telephone Encounter (Signed)
Smartphone/consent/ my chart/ pre reg completed °

## 2018-11-23 ENCOUNTER — Telehealth: Payer: Self-pay | Admitting: Radiology

## 2018-11-23 ENCOUNTER — Telehealth: Payer: Self-pay

## 2018-11-23 ENCOUNTER — Telehealth (INDEPENDENT_AMBULATORY_CARE_PROVIDER_SITE_OTHER): Payer: BLUE CROSS/BLUE SHIELD | Admitting: Internal Medicine

## 2018-11-23 ENCOUNTER — Encounter: Payer: Self-pay | Admitting: Internal Medicine

## 2018-11-23 VITALS — BP 149/97 | HR 65 | Ht 78.0 in | Wt 347.0 lb

## 2018-11-23 DIAGNOSIS — R7303 Prediabetes: Secondary | ICD-10-CM

## 2018-11-23 DIAGNOSIS — R002 Palpitations: Secondary | ICD-10-CM | POA: Diagnosis not present

## 2018-11-23 DIAGNOSIS — N183 Chronic kidney disease, stage 3 unspecified: Secondary | ICD-10-CM

## 2018-11-23 DIAGNOSIS — I10 Essential (primary) hypertension: Secondary | ICD-10-CM

## 2018-11-23 DIAGNOSIS — G4733 Obstructive sleep apnea (adult) (pediatric): Secondary | ICD-10-CM

## 2018-11-23 MED ORDER — AMLODIPINE BESYLATE 10 MG PO TABS: 10.0000 mg | ORAL_TABLET | Freq: Every day | ORAL | 3 refills | Status: DC

## 2018-11-23 MED ORDER — LISINOPRIL 40 MG PO TABS: 40.0000 mg | ORAL_TABLET | Freq: Every day | ORAL | 3 refills | Status: DC

## 2018-11-23 NOTE — Progress Notes (Signed)
Virtual Visit via Video Note   This visit type was conducted due to national recommendations for restrictions regarding the COVID-19 Pandemic (e.g. social distancing) in an effort to limit this patient's exposure and mitigate transmission in our community.  Due to his co-morbid illnesses, this patient is at least at moderate risk for complications without adequate follow up.  This format is felt to be most appropriate for this patient at this time.  All issues noted in this document were discussed and addressed.  A limited physical exam was performed with this format.  Please refer to the patient's chart for his consent to telehealth [facility].   Date:  11/23/2018   ID:  Derrick RouxIsaac Lahue Jr., DOB 02-25-1971, MRN [id number]  Patient Location: Home Provider Location: Home  PCP:  [facility]  Cardiologist:  Parke PoissonGayatri A Jilliann Subramanian, MD  Electrophysiologist:  None   Evaluation Performed:  Follow-Up Visit  Chief Complaint:  F/u HTN  History of Present Illness:    Derrick Rouxsaac Ghrist Jr. is a 48 y.o. male with a hx of hypertension, chronic renal insufficiency, gout, and obesity.  He presents today for evaluation of hypertension.  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  He follows with Dr. Elvis CoilMartin Webb in nephrology and plans to call to reschedule follow up on Monday 5/18.   He has a new symptom since starting carvediolol. While carvedilol has given him excellent BP control, he also notices a "fluttering" in his chest. He has some palpitations 3 x a day, 2-3 x a week. They are brief, and he notes his heart skips a beat, and feels slow enough that he can't feel it. His lowest recorded pulse is 57 bpm. No presyncope, dizziness or lightheadedness. No headache or sob.  Caffeine: rare Alcohol: none at all Water intake: adequate Snoring: using CPAP TSH: normal Herbal supplements/diet products: no Syncope/presyncope: no   Amlodipine at night per our pharmacist in HTN clinic to help with BP control since he works 3rd shift. He is tolerating this transition in med timing well.   He has been riding bikes with his son and doing yard work and has been asymptomatic from a cardiopulmonary standpoint.   The patient denies chest pain, chest pressure, dyspnea at rest or with exertion, PND, orthopnea, or leg swelling. No syncope.Has sleep apnea and uses CPAP.   BPs in April 138/88, 136/89, 143/93, 138/88.  The patient does not have symptoms concerning for COVID-19 infection (fever, chills, cough, or new shortness of breath).    Past Medical History:  Diagnosis Date  . Hypertension    No past surgical history on file.   Current Meds  Medication Sig  . acetaminophen (TYLENOL) 500 MG tablet Take 1 tablet (500 mg total) every 6 (six) hours as needed by mouth.  Marland Kitchen. amLODipine (NORVASC) 10 MG tablet TAKE 1 TABLET BY MOUTH EVERY DAY  . atorvastatin (LIPITOR) 20 MG tablet Take 1 tablet (20 mg total) by mouth daily.  . carvedilol (COREG) 25 MG tablet Take 1 tablet (25 mg total) by mouth 2 (two) times daily with a meal.  . lisinopril (ZESTRIL) 40 MG tablet TAKE 1 TABLET BY MOUTH EVERY DAY     Allergies:   Patient has no known allergies.   Social History   Tobacco Use  . Smoking status: Never Smoker  . Smokeless tobacco: Never Used  Substance Use Topics  . Alcohol use: No  . Drug  use: No     Family Hx: The patient's family history includes Diabetes in his father; Hypertension in his father, mother, and sister.  ROS:   Please see the history of present illness.     All other systems reviewed and are negative.   Prior CV studies:   The following studies were reviewed today:    Labs/Other Tests and Data Reviewed:    EKG:  No ECG reviewed.  Recent Labs: 05/02/2018: ALT 33; BUN 14; Creatinine, Ser 1.74; Hemoglobin 14.3; Platelets 205; Potassium 4.4; Sodium 144; TSH 1.060   Recent Lipid Panel Lab  Results  Component Value Date/Time   CHOL 162 05/02/2018 04:35 PM   TRIG 109 05/02/2018 04:35 PM   HDL 32 (L) 05/02/2018 04:35 PM   CHOLHDL 5.1 (H) 05/02/2018 04:35 PM   CHOLHDL 5.1 (H) 04/11/2016 10:17 AM   LDLCALC 108 (H) 05/02/2018 04:35 PM    Wt Readings from Last 3 Encounters:  11/23/18 (!) 347 lb (157.4 kg)  09/18/18 (!) 344 lb 9.6 oz (156.3 kg)  08/28/18 (!) 347 lb (157.4 kg)     Objective:    Vital Signs:  BP (!) 149/97   Pulse 65   Ht 6\' 6"  (1.981 m)   Wt (!) 347 lb (157.4 kg)   BMI 40.10 kg/m    VITAL SIGNS:  reviewed GEN:  no acute distress EYES:  sclerae anicteric, EOMI - Extraocular Movements Intact RESPIRATORY:  normal respiratory effort, symmetric expansion CARDIOVASCULAR:  no peripheral edema SKIN:  no rash, lesions or ulcers. MUSCULOSKELETAL:  no obvious deformities. NEURO:  alert and oriented x 3, no obvious focal deficit PSYCH:  normal affect  ASSESSMENT & PLAN:    1. Essential hypertension   2. Palpitations   3. CKD (chronic kidney disease) stage 3, GFR 30-59 ml/min (HCC)   4. Severe obstructive sleep apnea   5. Prediabetes   6. Obesity, Class III, BMI 40-49.9 (morbid obesity) (HCC)    Palpitations - we will order an extended monitor to evaluate his chest fluttering. I am suspicious he may be having brief pauses due to Bblocker therapy, or heart block. We will investigate. Fortunately he does not have presyncope or dizziness. We will adjust carvedilol if needed.   HTN - stable on current therapy. I would like to add one additional medication likely doxazosin to get blood pressure consistently below 140/90. We participated in shared decision making. The patient would like to wait until monitor results are back and make med changes at that time if needed. This is reasonable.   OSA - stable on CPAP.   CKD - follow up with nephrology planned soon.   F/u in 1 mo to discuss monitor results and medication adjustments.    COVID-19 Education: The  signs and symptoms of COVID-19 were discussed with the patient and how to seek care for testing (follow up with PCP or arrange E-visit).  The importance of social distancing was discussed today.  Time:   Today, I have spent 20 minutes with the patient with telehealth technology discussing the above problems.     Medication Adjustments/Labs and Tests Ordered: Current medicines are reviewed at length with the patient today.  Concerns regarding medicines are outlined above.   Tests Ordered: No orders of the defined types were placed in this encounter.   Medication Changes: No orders of the defined types were placed in this encounter.   Disposition:  Follow up in 1 month(s)  Signed, Parke Poisson, MD  11/23/2018 10:36  AM    [place]

## 2018-11-23 NOTE — Telephone Encounter (Signed)
  Contacted patient to discuss AVS instructions. Informed patient that a monitor tech would contact him about his monitor. Patient agreeable with Dr Lupe Carney recommendations and voiced understanding.

## 2018-11-23 NOTE — Telephone Encounter (Signed)
Enrolled patient for a 7 day Zio monitor to be mailed. Brief instructions were gone over with the patient and he knows to expect the monitor to arrive in 3-4 days 

## 2018-11-23 NOTE — Patient Instructions (Signed)
Medication Instructions:  Your physician recommends that you continue on your current medications as directed. Please refer to the Current Medication list given to you today. If you need a refill on your cardiac medications before your next appointment, please call your pharmacy.   Lab work: None  If you have labs (blood work) drawn today and your tests are completely normal, you will receive your results only by: Marland Kitchen MyChart Message (if you have MyChart) OR . A paper copy in the mail If you have any lab test that is abnormal or we need to change your treatment, we will call you to review the results.  Testing/Procedures: Your physician has recommended that you wear a 7 DAY ZIO-PATCH monitor. The Zio patch cardiac monitor continuously records heart rhythm data for up to 14 days, this is for patients being evaluated for multiple types heart rhythms. For the first 24 hours post application, please avoid getting the Zio monitor wet in the shower or by excessive sweating during exercise. After that, feel free to carry on with regular activities. Keep soaps and lotions away from the ZIO XT Patch.  This will be placed at our [facility] location - [address], [address].       THE MONITOR LADIES WILL CONTACT YOU AND GO OVER INSTRUCTIONS FOR MONITOR  Follow-Up: At [facility] you and your health needs are our priority.  As part of our continuing mission to provide you with exceptional heart care, we have created designated Provider Care Teams.  These Care Teams include your primary Cardiologist (physician) and Advanced Practice Providers (APPs -  Physician Assistants and Nurse Practitioners) who all work together to provide you with the care you need, when you need it. You will need a follow up appointment in 1 months.  You may see Parke Poisson, MD or one of the following Advanced Practice Providers on your designated Care Team:   Theodore Demark, PA-C . Joni Reining, DNP, ANP  Any  Other Special Instructions Will Be Listed Below (If Applicable).

## 2018-12-03 ENCOUNTER — Other Ambulatory Visit (INDEPENDENT_AMBULATORY_CARE_PROVIDER_SITE_OTHER): Payer: BC Managed Care – PPO

## 2018-12-03 DIAGNOSIS — R002 Palpitations: Secondary | ICD-10-CM | POA: Diagnosis not present

## 2018-12-03 DIAGNOSIS — G4733 Obstructive sleep apnea (adult) (pediatric): Secondary | ICD-10-CM | POA: Diagnosis not present

## 2018-12-03 DIAGNOSIS — I10 Essential (primary) hypertension: Secondary | ICD-10-CM

## 2018-12-03 DIAGNOSIS — N183 Chronic kidney disease, stage 3 (moderate): Secondary | ICD-10-CM

## 2018-12-03 DIAGNOSIS — R7303 Prediabetes: Secondary | ICD-10-CM

## 2018-12-21 ENCOUNTER — Other Ambulatory Visit: Payer: Self-pay

## 2018-12-24 ENCOUNTER — Telehealth: Payer: Self-pay | Admitting: Internal Medicine

## 2018-12-24 NOTE — Telephone Encounter (Signed)
Mychart, smartphone, consent, pre reg complete 12/24/18 AF

## 2018-12-25 ENCOUNTER — Telehealth: Payer: Self-pay | Admitting: *Deleted

## 2018-12-25 NOTE — Telephone Encounter (Signed)
SPOKE TO PATIENT  INSTRUCTION GIVEN FOR  VIRTUAL VISIT  6/18 . VIDEO /TEXT. PATIENT VERBALIZED CONSENT . , VERBALIZED UNDERSTANDING

## 2018-12-27 ENCOUNTER — Encounter: Payer: Self-pay | Admitting: Internal Medicine

## 2018-12-27 ENCOUNTER — Telehealth (INDEPENDENT_AMBULATORY_CARE_PROVIDER_SITE_OTHER): Payer: BC Managed Care – PPO | Admitting: Internal Medicine

## 2018-12-27 VITALS — BP 186/102 | HR 65 | Ht 78.0 in | Wt 350.0 lb

## 2018-12-27 DIAGNOSIS — R7303 Prediabetes: Secondary | ICD-10-CM

## 2018-12-27 DIAGNOSIS — N183 Chronic kidney disease, stage 3 unspecified: Secondary | ICD-10-CM

## 2018-12-27 DIAGNOSIS — R002 Palpitations: Secondary | ICD-10-CM | POA: Diagnosis not present

## 2018-12-27 DIAGNOSIS — G4733 Obstructive sleep apnea (adult) (pediatric): Secondary | ICD-10-CM

## 2018-12-27 DIAGNOSIS — I471 Supraventricular tachycardia: Secondary | ICD-10-CM

## 2018-12-27 DIAGNOSIS — I10 Essential (primary) hypertension: Secondary | ICD-10-CM

## 2018-12-27 DIAGNOSIS — Z8739 Personal history of other diseases of the musculoskeletal system and connective tissue: Secondary | ICD-10-CM

## 2018-12-27 NOTE — Patient Instructions (Addendum)
Medication Instructions:   Not  needed  If you need a refill on your cardiac medications before your next appointment, please call your pharmacy.   Lab work:  Not needed    Testing/Procedures: Will be schedule at  [place] has requested that you have an echocardiogram. Echocardiography is a painless test that uses sound waves to create images of your heart. It provides your doctor with information about the size and shape of your heart and how well your heart's chambers and valves are working. This procedure takes approximately one hour. There are no restrictions for this procedure.    Follow-Up: At [facility], you and your health needs are our priority.  As part of our continuing mission to provide you with exceptional heart care, we have created designated Provider Care Teams.  These Care Teams include your primary Cardiologist (physician) and Advanced Practice Providers (APPs -  Physician Assistants and Nurse Practitioners) who all work together to provide you with the care you need, when you need it. . You will need a follow up appointment in  1 month - virtual.  Please call our office 2 months in advance to schedule this appointment.  You may see Elouise Munroe, MD*or one of the following Advanced Practice Providers on your designated Care Team:   . Rosaria Ferries, PA-C . Jory Sims, DNP, ANP  Any Other Special Instructions Will Be Listed Below (If Applicable).

## 2018-12-27 NOTE — Progress Notes (Signed)
Virtual Visit via Video Note   This visit type was conducted due to national recommendations for restrictions regarding the COVID-19 Pandemic (e.g. social distancing) in an effort to limit this patient's exposure and mitigate transmission in our community.  Due to his co-morbid illnesses, this patient is at least at moderate risk for complications without adequate follow up.  This format is felt to be most appropriate for this patient at this time.  All issues noted in this document were discussed and addressed.  A limited physical exam was performed with this format.  Please refer to the patient's chart for his consent to telehealth for [facility].   Date:  12/27/2018   ID:  Ulyses Southward., DOB [birth year], MRN [id number]  Patient Location: Home Provider Location: Office  PCP:  [facility]  Cardiologist:  Elouise Munroe, MD  Electrophysiologist:  None   Evaluation Performed:  Follow-Up Visit  Chief Complaint: Follow-up hypertension  History of Present Illness:    Khayman Kirsch. is a 48 y.o. male with hypertension, chronic renal insufficiency, gout, and obesity.He presents today for follow-up of hypertension.  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  At our last appointment the patient endorsed a fluttering in his chest with palpitations occurring despite the use of carvedilol.  I was concerned for possible pauses or AV block given that he was on carvedilol.  We reviewed the results of his monitor. Infrequent SVT and NSVT as well as rare SVE and VE associated with his symptoms of palpitations. We discussed that carvedilol is the right medicine to address this and we can discuss other therapy as well. He would like to stay with current dose at this time, and is reassured about monitor findings. He feels his palpitations are stable and slightly improved.  The patient denies chest pain, chest pressure, dyspnea at rest or with  exertion, PND, orthopnea, or leg swelling. Denies syncope or presyncope. Denies dizziness or lightheadedness.  The patient does not have symptoms concerning for COVID-19 infection (fever, chills, cough, or new shortness of breath).    Past Medical History:  Diagnosis Date  . Hypertension    No past surgical history on file.   Current Meds  Medication Sig  . acetaminophen (TYLENOL) 500 MG tablet Take 1 tablet (500 mg total) every 6 (six) hours as needed by mouth.  Marland Kitchen amLODipine (NORVASC) 10 MG tablet Take 1 tablet (10 mg total) by mouth daily.  Marland Kitchen atorvastatin (LIPITOR) 20 MG tablet Take 1 tablet (20 mg total) by mouth daily.  . carvedilol (COREG) 25 MG tablet Take 1 tablet (25 mg total) by mouth 2 (two) times daily with a meal.  . lisinopril (ZESTRIL) 40 MG tablet Take 1 tablet (40 mg total) by mouth daily.     Allergies:   Patient has no known allergies.   Social History   Tobacco Use  . Smoking status: Never Smoker  . Smokeless tobacco: Never Used  Substance Use Topics  . Alcohol use: No  . Drug use: No     Family Hx: The patient's family history includes Diabetes in his father; Hypertension in his father, mother, and sister.  ROS:   Please see the history of present illness.     All other systems reviewed and are negative.   Prior CV studies:   The following studies were reviewed today:    Labs/Other Tests and Data Reviewed:    EKG:  No ECG reviewed.  Recent Labs: 05/02/2018: ALT 33; BUN 14; Creatinine, Ser 1.74; Hemoglobin 14.3; Platelets 205; Potassium 4.4; Sodium 144; TSH 1.060   Recent Lipid Panel Lab Results  Component Value Date/Time   CHOL 162 05/02/2018 04:35 PM   TRIG 109 05/02/2018 04:35 PM   HDL 32 (L) 05/02/2018 04:35 PM   CHOLHDL 5.1 (H) 05/02/2018 04:35 PM   CHOLHDL 5.1 (H) 04/11/2016 10:17 AM   LDLCALC 108 (H) 05/02/2018 04:35 PM    Wt Readings from Last 3 Encounters:  12/27/18 (!) 350 lb (158.8 kg)  11/23/18 (!) 347 lb (157.4 kg)   09/18/18 (!) 344 lb 9.6 oz (156.3 kg)     Objective:    Vital Signs:  BP (!) 186/102   Pulse 65   Ht 6\' 6"  (1.981 m)   Wt (!) 350 lb (158.8 kg)   BMI 40.45 kg/m    Repeat BP: 152/92 58  VITAL SIGNS:  reviewed GEN:  no acute distress EYES:  sclerae anicteric, EOMI - Extraocular Movements Intact RESPIRATORY:  normal respiratory effort, symmetric expansion CARDIOVASCULAR:  no peripheral edema SKIN:  no rash, lesions or ulcers. MUSCULOSKELETAL:  no obvious deformities. NEURO:  alert and oriented x 3, no obvious focal deficit PSYCH:  normal affect  ASSESSMENT & PLAN:    1. SVT (supraventricular tachycardia) (HCC)   2. Nonsustained supraventricular tachycardia (HCC)   3. Essential hypertension   4. Palpitations   5. CKD (chronic kidney disease) stage 3, GFR 30-59 ml/min (HCC)   6. Severe obstructive sleep apnea   7. Prediabetes   8. Obesity, Class III, BMI 40-49.9 (morbid obesity) (HCC)   9. History of gout    SVT/NSVT/palpitations - given his symptomatic palpitations with infrequent SVT and NSVT and history of hypertension, I would like to obtain an echocardiogram to evaluate structure and function as well as myocardial thickness with long standing hypertension.   HTN - continue current therapy. He has been well controlled but has had increased stress in his day to day life which may be contributing to slightly higher readings today. He would like to observe for now, we participated in shared decision making.   HLD - discussed diet and lifestyle modification, we will recheck lipids at next visit so as to avoid unnecessary exposure to COVID 19. Continue atorvastatin 20 mg daily currently.   COVID-19 Education: The signs and symptoms of COVID-19 were discussed with the patient and how to seek care for testing (follow up with PCP or arrange E-visit).  The importance of social distancing was discussed today.  Time:   Today, I have spent 25 minutes with the patient with telehealth  technology discussing the above problems.     Medication Adjustments/Labs and Tests Ordered: Current medicines are reviewed at length with the patient today.  Concerns regarding medicines are outlined above.   Tests Ordered: Orders Placed This Encounter  Procedures  . ECHOCARDIOGRAM COMPLETE    Medication Changes: No orders of the defined types were placed in this encounter.   Follow Up:  Virtual Visit or In Person in 1 month(s)  Signed, Parke PoissonGayatri A Demetria Lightsey, MD  12/27/2018 6:00 PM    [facility]

## 2019-01-10 ENCOUNTER — Other Ambulatory Visit (HOSPITAL_COMMUNITY): Payer: BC Managed Care – PPO

## 2019-01-16 ENCOUNTER — Other Ambulatory Visit: Payer: Self-pay

## 2019-01-16 ENCOUNTER — Ambulatory Visit (HOSPITAL_COMMUNITY): Payer: BC Managed Care – PPO | Attending: Cardiovascular Disease

## 2019-01-16 DIAGNOSIS — I471 Supraventricular tachycardia: Secondary | ICD-10-CM | POA: Diagnosis not present

## 2019-01-16 DIAGNOSIS — I10 Essential (primary) hypertension: Secondary | ICD-10-CM | POA: Diagnosis not present

## 2019-02-01 ENCOUNTER — Encounter: Payer: Self-pay | Admitting: Internal Medicine

## 2019-02-01 ENCOUNTER — Telehealth: Payer: Self-pay | Admitting: *Deleted

## 2019-02-01 ENCOUNTER — Telehealth (INDEPENDENT_AMBULATORY_CARE_PROVIDER_SITE_OTHER): Payer: BC Managed Care – PPO | Admitting: Internal Medicine

## 2019-02-01 VITALS — BP 173/107 | HR 66 | Ht 78.0 in

## 2019-02-01 DIAGNOSIS — G4733 Obstructive sleep apnea (adult) (pediatric): Secondary | ICD-10-CM | POA: Diagnosis not present

## 2019-02-01 DIAGNOSIS — N183 Chronic kidney disease, stage 3 unspecified: Secondary | ICD-10-CM

## 2019-02-01 DIAGNOSIS — I10 Essential (primary) hypertension: Secondary | ICD-10-CM | POA: Diagnosis not present

## 2019-02-01 DIAGNOSIS — R002 Palpitations: Secondary | ICD-10-CM

## 2019-02-01 DIAGNOSIS — I471 Supraventricular tachycardia: Secondary | ICD-10-CM

## 2019-02-01 MED ORDER — DOXAZOSIN MESYLATE 2 MG PO TABS: 2.0000 mg | ORAL_TABLET | Freq: Every day | ORAL | 3 refills | Status: DC

## 2019-02-01 NOTE — Patient Instructions (Addendum)
Medication Instructions:   Start taking doxazosin 2 mg daily  Please follow up with nephrology - would like to discuss with them starting spironolactone given evidence of grade 2 diastolic dysfunction on echo.   If you need a refill on your cardiac medications before your next appointment, please call your pharmacy.   Lab work:  NOT NEEDED    Testing/Procedures: NOT NEEDED  Follow-Up: At [facility], you and your health needs are our priority.  As part of our continuing mission to provide you with exceptional heart care, we have created designated Provider Care Teams.  These Care Teams include your primary Cardiologist (physician) and Advanced Practice Providers (APPs -  Physician Assistants and Nurse Practitioners) who all work together to provide you with the care you need, when you need it. . You will need a follow up appointment in 6 months JAN.  Please call our office 2 months in advance to schedule this appointment.  You may see Elouise Munroe, MD  . Jory Sims, DNP, ANP- Your physician recommends that you schedule a follow-up appointment in 3 MONTHS  .   Any Other Special Instructions Will Be Listed Below (If Applicable).  Please follow up with nephrology - would like to discuss with them starting spironolactone given evidence of grade 2 diastolic dysfunction on echo.

## 2019-02-01 NOTE — Progress Notes (Signed)
Virtual Visit via Telephone Note   This visit type was conducted due to national recommendations for restrictions regarding the COVID-19 Pandemic (e.g. social distancing) in an effort to limit this patient's exposure and mitigate transmission in our community.  Due to his co-morbid illnesses, this patient is at least at moderate risk for complications without adequate follow up.  This format is felt to be most appropriate for this patient at this time.  The patient did not have access to video technology/had technical difficulties with video requiring transitioning to audio format only (telephone).  All issues noted in this document were discussed and addressed.  No physical exam could be performed with this format.  Please refer to the patient's chart for his  consent to telehealth [facility].   Date:  02/01/2019   ID:  Derrick RouxIsaac Vanderveen Jr., DOB 08-Sep-1970, MRN [id number]  Patient Location: Home Provider Location: Home  PCP:  [facility]  Cardiologist:  Parke PoissonGayatri A Amelie Caracci, MD  Electrophysiologist:  None   Evaluation Performed:  Follow-Up Visit  Chief Complaint:  F/u HTN, f/u echo  History of Present Illness:    Derrick Rouxsaac Shroff Jr. is a 48 y.o. male with  a hx of hypertension, chronic renal insufficiency, gout, and obesity. He presents today for evaluation of hypertension. 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 He follows with Dr. Elvis CoilMartin Webb in nephrology and plans to call to reschedule follow up soon due to COVID-19 delays.  We reviewed the echocardiogram today performed on 01/16/2019. Mild LVH and grade 2 diastolic dysfunction. No significant valvular abnormalities. We reviewed that we will continue to aggressively control blood pressure to avoid further myocardial hypertrophy.   We have tried to control blood pressure with our current regimen, weight loss and exercise, however stress from his job and  schedule have lead to suboptimal blood pressure control. We discussed additional options for blood pressure control today.  The patient denies chest pain, chest pressure, dyspnea at rest or with exertion, palpitations, PND, orthopnea, or leg swelling. Denies syncope or presyncope. Denies dizziness or lightheadedness.  The patient does not have symptoms concerning for COVID-19 infection (fever, chills, cough, or new shortness of breath).    Past Medical History:  Diagnosis Date  . Hypertension    No past surgical history on file.   Current Meds  Medication Sig  . acetaminophen (TYLENOL) 500 MG tablet Take 1 tablet (500 mg total) every 6 (six) hours as needed by mouth.  Marland Kitchen. amLODipine (NORVASC) 10 MG tablet Take 1 tablet (10 mg total) by mouth daily.  Marland Kitchen. atorvastatin (LIPITOR) 20 MG tablet Take 1 tablet (20 mg total) by mouth daily.  . carvedilol (COREG) 25 MG tablet Take 1 tablet (25 mg total) by mouth 2 (two) times daily with a meal.  . lisinopril (ZESTRIL) 40 MG tablet Take 1 tablet (40 mg total) by mouth daily.     Allergies:   Patient has no known allergies.   Social History   Tobacco Use  . Smoking status: Never Smoker  . Smokeless tobacco: Never Used  Substance Use Topics  . Alcohol use: No  . Drug use: No     Family Hx: The patient's family history includes Diabetes in his father; Hypertension in his father, mother, and sister.  ROS:   Please see the history of present illness.     All other systems reviewed and are negative.   Prior CV  studies:   The following studies were reviewed today:    Labs/Other Tests and Data Reviewed:    EKG:  No ECG reviewed.  Recent Labs: 05/02/2018: ALT 33; BUN 14; Creatinine, Ser 1.74; Hemoglobin 14.3; Platelets 205; Potassium 4.4; Sodium 144; TSH 1.060   Recent Lipid Panel Lab Results  Component Value Date/Time   CHOL 162 05/02/2018 04:35 PM   TRIG 109 05/02/2018 04:35 PM   HDL 32 (L) 05/02/2018 04:35 PM   CHOLHDL 5.1 (H)  05/02/2018 04:35 PM   CHOLHDL 5.1 (H) 04/11/2016 10:17 AM   LDLCALC 108 (H) 05/02/2018 04:35 PM    Wt Readings from Last 3 Encounters:  12/27/18 (!) 350 lb (158.8 kg)  11/23/18 (!) 347 lb (157.4 kg)  09/18/18 (!) 344 lb 9.6 oz (156.3 kg)     Objective:    Vital Signs:  BP (!) 173/107   Pulse 66   Ht 6\' 6"  (1.981 m)   BMI 40.45 kg/m    VITAL SIGNS:  reviewed GEN:  no acute distress  ASSESSMENT & PLAN:    1. Essential hypertension   2. CKD (chronic kidney disease) stage 3, GFR 30-59 ml/min (HCC)   3. Obesity, Class III, BMI 40-49.9 (morbid obesity) ([place])   4. Severe obstructive sleep apnea   5. Palpitations   6. Nonsustained supraventricular tachycardia (HCC)    HTN - suboptimal control, we will initiate and titrate doxazosin 2mg  daily. Patient has CKD, so we will avoid other medications with renal impact until he his able to review with Dr. Justin Mend.  pSVT - patient and I participated in shared decision making and no additional therapy is required at this time. He continues on carvedilol.   OSA - continue cpap  CKD - planning to schedule nephrology follow up after our call. I would like to discuss with them starting spironolactone given evidence of grade 2 diastolic dysfunction on echo.  F/u 6 mo.   COVID-19 Education: The signs and symptoms of COVID-19 were discussed with the patient and how to seek care for testing (follow up with PCP or arrange E-visit).  The importance of social distancing was discussed today.  Time:   Today, I have spent 25 minutes with the patient with telehealth technology discussing the above problems.     Medication Adjustments/Labs and Tests Ordered: Current medicines are reviewed at length with the patient today.  Concerns regarding medicines are outlined above.   Tests Ordered: No orders of the defined types were placed in this encounter.   Medication Changes: No orders of the defined types were placed in this encounter.   Signed,  Elouise Munroe, MD  02/01/2019 10:56 AM    [name]

## 2019-02-01 NOTE — Telephone Encounter (Signed)
SPOKE TO PATIENT - INSTRUCTION FROM TELE-VISIT 02/01/19. AVS SUMMARY  SENT VIA MYCHART . RX  SENT TO PHARMACY

## 2019-04-17 NOTE — Progress Notes (Signed)
Virtual Visit via Video Note   This visit type was conducted due to national recommendations for restrictions regarding the COVID-19 Pandemic (e.g. social distancing) in an effort to limit this patient's exposure and mitigate transmission in our community.  Due to his co-morbid illnesses, this patient is at least at moderate risk for complications without adequate follow up.  This format is felt to be most appropriate for this patient at this time.  All issues noted in this document were discussed and addressed.  A limited physical exam was performed with this format.  Please refer to the patient's chart for his consent to telehealth for [facility].   Date:  04/18/2019   ID:  Derrick Cain., DOB 1970/11/26, MRN [id number]  Patient Location: Home Provider Location: Home  PCP:  [facility]  Cardiologist:  Parke Poisson, MD Electrophysiologist:  None   Evaluation Performed:  Follow-Up Visit  Chief Complaint:  Hypertension   History of Present Illness:    Derrick Cain. is a 48 y.o. male we are following for ongoing assessment and management of hypertension, other history includes chronic renal insufficiency gout and obesity.  The patient also is followed routinely for DOT physicals.  He is followed by nephrology, Dr. Hyman Hopes.  Last seen in the office on 02/01/2019 with review of echocardiogram performed 2 weeks earlier revealing mild LVH with grade 2 diastolic dysfunction.  He did not have any significant valvular abnormalities.  His blood pressure was noted to be difficult to control.  This was contributed to stress on his job and his working schedule.  The patient was initiated on doxazosin 2 mg daily.  He was to follow-up with nephrology for further recommendations concerning non-nephrotoxic medications for blood pressure management as well.  He was continued on carvedilol.  Consideration for addition of spironolactone was going to be discussed with  nephrology prior to initiation.  Mr. Morera blood pressure is not well controlled.  He has not been started on Spironolactone as this was not recommended by nephrology.  He has not seen nephrology recently and is due to have follow-up scheduled.  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  He is compliant with CPAP, but does not have a lot of energy due to his intermittent sleep pattern and working nights.  He denies any chest pain dizziness but he does have some mild dependent edema.  The patient does not have symptoms concerning for COVID-19 infection (fever, chills, cough, or new shortness of breath).    Past Medical History:  Diagnosis Date  . Hypertension    No past surgical history on file.   Current Meds  Medication Sig  . acetaminophen (TYLENOL) 500 MG tablet Take 1 tablet (500 mg total) every 6 (six) hours as needed by mouth.  Marland Kitchen amLODipine (NORVASC) 10 MG tablet Take 1 tablet (10 mg total) by mouth daily.  Marland Kitchen atorvastatin (LIPITOR) 20 MG tablet Take 1 tablet (20 mg total) by mouth daily.  . carvedilol (COREG) 25 MG tablet Take 1 tablet (25 mg total) by mouth 2 (two) times daily with a meal.  . doxazosin (CARDURA) 2 MG tablet Take 1 tablet (2 mg total) by mouth daily.  Marland Kitchen lisinopril (ZESTRIL) 40 MG tablet Take 1 tablet (40 mg total) by mouth daily.     Allergies:   Patient  has no known allergies.   Social History   Tobacco Use  . Smoking status: Never Smoker  . Smokeless tobacco: Never Used  Substance Use Topics  . Alcohol use: No  . Drug use: No     Family Hx: The patient's family history includes Diabetes in his father; Hypertension in his father, mother, and sister.  ROS:   Please see the history of present illness.    All other systems reviewed and are negative.   Prior CV studies:   The following studies were reviewed today:   Echocardiogram 01/16/2019  1. The left ventricle has normal systolic function with an ejection fraction of 60-65%. The cavity size was normal. There is mild concentric left ventricular hypertrophy. Left ventricular diastolic Doppler parameters are consistent with  pseudonormalization. Elevated mean left atrial pressure No evidence of left ventricular regional wall motion abnormalities.  2. The right ventricle has normal systolic function. The cavity was normal. There is no increase in right ventricular wall thickness. Right ventricular systolic pressure is normal with an estimated pressure of 29.0 mmHg.  3. Left atrial size was mildly dilated.  4. The aortic valve is tricuspid. Mild thickening of the aortic valve. Mild calcification of the aortic valve. No stenosis of the aortic valve.  5. The aortic root and ascending aorta are normal in size and structure.   Labs/Other Tests and Data Reviewed:    EKG:  No ECG reviewed.  Recent Labs: 05/02/2018: ALT 33; BUN 14; Creatinine, Ser 1.74; Hemoglobin 14.3; Platelets 205; Potassium 4.4; Sodium 144; TSH 1.060   Recent Lipid Panel Lab Results  Component Value Date/Time   CHOL 162 05/02/2018 04:35 PM   TRIG 109 05/02/2018 04:35 PM   HDL 32 (L) 05/02/2018 04:35 PM   CHOLHDL 5.1 (H) 05/02/2018 04:35 PM   CHOLHDL 5.1 (H) 04/11/2016 10:17 AM   LDLCALC 108 (H) 05/02/2018 04:35 PM    Wt Readings from Last 3 Encounters:  04/18/19 (!) 365 lb (165.6 kg)  12/27/18 (!) 350 lb (158.8 kg)  11/23/18 (!) 347 lb (157.4 kg)     Objective:    Vital Signs:  BP (!) 165/93   Pulse 72   Ht 6\' 6"  (1.981 m)   Wt (!) 365 lb (165.6 kg)   BMI 42.18 kg/m    VITAL SIGNS:  reviewed GEN:  no acute distress EYES:  sclerae anicteric, EOMI - Extraocular Movements Intact RESPIRATORY:  normal respiratory effort, symmetric expansion MUSCULOSKELETAL:  no obvious deformities. NEURO:  alert and oriented x 3, no obvious focal deficit PSYCH:  normal affect  ASSESSMENT  & PLAN:    1.  Hypertension: Blood pressure remains elevated despite multiple medications.  We are restricted on diuretics due to chronic kidney disease.  I will add hydralazine 25 mg twice daily to his medication regimen.  He will follow-up in 1 month for blood pressure check which will be done virtually.  The patient does have intermittent, and interrupted sleep patterns as he has children at home, he works nights, and has appointments during the daytime.  This may also be contributing to difficulty in controlling BP.  He is advised to reduce the salt in his diet and increase his exercise to assist with weight loss and better blood pressure control.  He verbalizes understanding and states that he continues to struggle with salt although he does not add salt to his foods he may be eating foods that are already salted.  We will send him a copy of the "salty  6" along with a low-sodium diet.  2.  History of OSA: He is compliant with CPAP.  3.  Chronic kidney disease stage III: Followed by nephrology.  Most recent labs in epic reveal creatinine of 1.74.  He is due for labs by nephrology on follow-up appointment.  COVID-19 Education: The signs and symptoms of COVID-19 were discussed with the patient and how to seek care for testing (follow up with PCP or arrange E-visit).  The importance of social distancing was discussed today.  Time:   Today, I have spent 15 minutes with the patient with telehealth technology discussing the above problems.     Medication Adjustments/Labs and Tests Ordered: Current medicines are reviewed at length with the patient today.  Concerns regarding medicines are outlined above.   Tests Ordered: No orders of the defined types were placed in this encounter.   Medication Changes: No orders of the defined types were placed in this encounter.   Disposition:  Follow up one month, virtually.   Signed, Bettey MareKathryn M. Liborio NixonLawrence DNP, ANP, AACC  04/18/2019 11:09 AM    [facility]

## 2019-04-18 ENCOUNTER — Encounter: Payer: Self-pay | Admitting: Adult Health

## 2019-04-18 ENCOUNTER — Telehealth (INDEPENDENT_AMBULATORY_CARE_PROVIDER_SITE_OTHER): Payer: BC Managed Care – PPO | Admitting: Adult Health

## 2019-04-18 VITALS — BP 165/93 | HR 72 | Ht 78.0 in | Wt 365.0 lb

## 2019-04-18 DIAGNOSIS — I10 Essential (primary) hypertension: Secondary | ICD-10-CM | POA: Diagnosis not present

## 2019-04-18 DIAGNOSIS — G4733 Obstructive sleep apnea (adult) (pediatric): Secondary | ICD-10-CM

## 2019-04-18 DIAGNOSIS — N1832 Chronic kidney disease, stage 3b: Secondary | ICD-10-CM

## 2019-04-18 DIAGNOSIS — Z9989 Dependence on other enabling machines and devices: Secondary | ICD-10-CM

## 2019-04-18 NOTE — Patient Instructions (Addendum)
Medication Instructions:  START- Hydralazine 25 mg by mouth twice a day  If you need a refill on your cardiac medications before your next appointment, please call your pharmacy.  Labwork: None Ordered   Testing/Procedures: None Ordered  Follow-Up: . Your physician recommends that you schedule a follow-up appointment in: Wednesday November 11th @ 1:15 pm   At [facility], you and your health needs are our priority.  As part of our continuing mission to provide you with exceptional heart care, we have created designated Provider Care Teams.  These Care Teams include your primary Cardiologist (physician) and Advanced Practice Providers (APPs -  Physician Assistants and Nurse Practitioners) who all work together to provide you with the care you need, when you need it.  Thank you for choosing [facility] at [facility]!!

## 2019-04-24 MED ORDER — HYDRALAZINE HCL 25 MG PO TABS: 25.0000 mg | ORAL_TABLET | Freq: Two times a day (BID) | ORAL | 3 refills | Status: DC

## 2019-05-22 ENCOUNTER — Ambulatory Visit: Payer: BC Managed Care – PPO | Admitting: Adult Health

## 2019-05-22 NOTE — Progress Notes (Deleted)
Cardiology Office Note   Date:  05/22/2019   ID:  Derrick Southward., DOB [birth year], MRN [id number]  PCP:  [facility]  Cardiologist: Dr.Acharya  No chief complaint on file.    History of Present Illness: Derrick Culton. is a 48 y.o. male who presents for ongoing assessment and management of hypertension, with other history to include chronic renal insufficiency, gout, and obesity.  He is also followed routinely for DOT physicals.  He is followed by nephrology by Dr. Justin Mend.  Echocardiogram and July 2020 revealed mild LVH with grade 2 diastolic dysfunction no valvular abnormalities with normal EF.  When seen last his blood pressure was not well controlled and he was initiated on doxazosin and was to follow-up with nephrology for further recommendations concerning nonnephrotoxic medications for blood pressure control.  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  He states he is compliant with CPAP but does not always have a lot of energy due to his intermittent sleep pattern.  He was advised on a low-sodium diet and increase his exercise as much as possible.  He was given a copy of "salty 6" along with a low-sodium diet.  He is here for follow-up concerning his blood pressure control.  Past Medical History:  Diagnosis Date  . Hypertension     No past surgical history on file.   Current Outpatient Medications  Medication Sig Dispense Refill  . acetaminophen (TYLENOL) 500 MG tablet Take 1 tablet (500 mg total) every 6 (six) hours as needed by mouth. 30 tablet 0  . amLODipine (NORVASC) 10 MG tablet Take 1 tablet (10 mg total) by mouth daily. 90 tablet 3  . atorvastatin (LIPITOR) 20 MG tablet Take 1 tablet (20 mg total) by mouth daily. 90 tablet 3  . carvedilol (COREG) 25 MG tablet Take 1 tablet (25  mg total) by mouth 2 (two) times daily with a meal. 180 tablet 2  . doxazosin (CARDURA) 2 MG tablet Take 1 tablet (2 mg total) by mouth daily. 90 tablet 3  . hydrALAZINE (APRESOLINE) 25 MG tablet Take 1 tablet (25 mg total) by mouth 2 (two) times daily. 180 tablet 3  . lisinopril (ZESTRIL) 40 MG tablet Take 1 tablet (40 mg total) by mouth daily. 90 tablet 3   No current facility-administered medications for this visit.     Allergies:   Patient has no known allergies.    Social History:  The patient  reports that he has never smoked. He has never used smokeless tobacco. He reports that he does not drink alcohol or use drugs.   Family History:  The patient's family history includes Diabetes in his father; Hypertension in his father, mother, and sister.    ROS: All other systems are reviewed and negative. Unless otherwise mentioned in H&P    PHYSICAL EXAM: VS:  There were no vitals taken for this visit. , BMI There is no height or weight on file to calculate BMI. GEN: Well nourished, well developed, in no acute distress HEENT: normal Neck: no JVD, carotid bruits, or masses Cardiac: ***RRR; no murmurs, rubs, or gallops,no edema  Respiratory:  Clear to auscultation bilaterally, normal work of breathing GI: soft, nontender, nondistended, + BS MS: no deformity or atrophy Skin: warm and dry, no rash Neuro:  Strength and  sensation are intact Psych: euthymic mood, full affect   EKG:  EKG {ACTION; IS/IS [id number]} ordered today. The ekg ordered today demonstrates ***   Recent Labs: No results found for requested labs within last 8760 hours.    Lipid Panel    Component Value Date/Time   CHOL 162 05/02/2018 1635   TRIG 109 05/02/2018 1635   HDL 32 (L) 05/02/2018 1635   CHOLHDL 5.1 (H) 05/02/2018 1635   CHOLHDL 5.1 (H) 04/11/2016 1017   VLDL 26 04/11/2016 1017   LDLCALC 108 (H) 05/02/2018 1635      Wt Readings from Last 3 Encounters:  04/18/19 (!) 365 lb (165.6 kg)   12/27/18 (!) 350 lb (158.8 kg)  11/23/18 (!) 347 lb (157.4 kg)      Other studies Reviewed: Additional studies/ records that were reviewed today include: ***. Review of the above records demonstrates: ***   ASSESSMENT AND PLAN:  1.  ***   Current medicines are reviewed at length with the patient today.    Labs/ tests ordered today include: *** Bettey Mare. Liborio Nixon, ANP, AACC   05/22/2019 7:03 AM    [facility] [address] Office [phone] Fax [phone]  Notice: This dictation was prepared with Dragon dictation along with smaller phrase technology. Any transcriptional errors that result from this process are unintentional and may not be corrected upon review.

## 2019-08-07 ENCOUNTER — Telehealth (INDEPENDENT_AMBULATORY_CARE_PROVIDER_SITE_OTHER): Payer: BC Managed Care – PPO | Admitting: Internal Medicine

## 2019-08-07 VITALS — BP 166/98 | HR 73 | Ht 78.0 in | Wt 365.0 lb

## 2019-08-07 DIAGNOSIS — I471 Supraventricular tachycardia: Secondary | ICD-10-CM

## 2019-08-07 DIAGNOSIS — N183 Chronic kidney disease, stage 3 unspecified: Secondary | ICD-10-CM

## 2019-08-07 DIAGNOSIS — Z9989 Dependence on other enabling machines and devices: Secondary | ICD-10-CM

## 2019-08-07 DIAGNOSIS — Z8739 Personal history of other diseases of the musculoskeletal system and connective tissue: Secondary | ICD-10-CM

## 2019-08-07 DIAGNOSIS — I10 Essential (primary) hypertension: Secondary | ICD-10-CM

## 2019-08-07 DIAGNOSIS — G4733 Obstructive sleep apnea (adult) (pediatric): Secondary | ICD-10-CM | POA: Diagnosis not present

## 2019-08-07 DIAGNOSIS — R002 Palpitations: Secondary | ICD-10-CM

## 2019-08-07 MED ORDER — ATORVASTATIN CALCIUM 20 MG PO TABS: 20.0000 mg | ORAL_TABLET | Freq: Every day | ORAL | 3 refills | Status: DC

## 2019-08-07 MED ORDER — DOXAZOSIN MESYLATE 4 MG PO TABS: 4.0000 mg | ORAL_TABLET | Freq: Every day | ORAL | 3 refills | Status: DC

## 2019-08-07 NOTE — Progress Notes (Signed)
Virtual Visit via Telephone Note   This visit type was conducted due to national recommendations for restrictions regarding the COVID-19 Pandemic (e.g. social distancing) in an effort to limit this patient's exposure and mitigate transmission in our community.  Due to his co-morbid illnesses, this patient is at least at moderate risk for complications without adequate follow up.  This format is felt to be most appropriate for this patient at this time.  The patient did not have access to video technology/had technical difficulties with video requiring transitioning to audio format only (telephone).  All issues noted in this document were discussed and addressed.  No physical exam could be performed with this format.  Please refer to the patient's chart for his  consent to telehealth for [facility].   Date:  08/07/2019   ID:  Derrick Cain., DOB 02/27/1971, MRN [id number]  Patient Location: Home Provider Location: Home  PCP:  [facility]  Cardiologist:  Parke Poisson, MD  Electrophysiologist:  None   Evaluation Performed:  Follow-Up Visit  Chief Complaint:  HTN  History of Present Illness:    Derrick Cain. is a 49 y.o. male with past medical history of hypertension, chronic renal insufficiency, gout, and obesity.  He presents today for follow-up of hypertension which we have been managing over the past year.  He notes that it has been challenging to manage his lifestyle and diet given stresses of life and his job.  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  He states that it is both concerns of contracting Covid as well as the daily stresses of his job that contribute to his increased stress level.  He knows that he wants to make adjustments to his lifestyle, we have discussed strategies for this today.  He follows with Dr. Elvis Coil in nephrology.  His last appointment was approximately 2 to 3 months  ago, and he is due for follow-up which she will call their office and set up today.  He does not recall any medication changes at his last appointment.  He is ready to get the COVID-19 vaccine and we have discussed this briefly today.  His sister is Dr. Velvet Bathe at [facility], and she has been able to give him a good medical perspective on the vaccine as well.   His atorvastatin was not appropriately refilled, I have shared with him that we will correct this immediately and continue a dose of 20 mg daily at this time.  He continues to have suboptimal control of his blood pressure.  He tells me that after he gave his vital signs during check-in today, he rechecked his blood pressure and it was 146/96.  While this blood pressure control is much better than it has been previously, it is still above goal.  We discussed strategies to lower that today.  Those strategies also include lifestyle factors which the patient independently endorses he will pursue.  The patient does not have symptoms concerning for COVID-19 infection (fever, chills, cough, or new shortness of breath).    Past Medical History:  Diagnosis Date  . Hypertension    No past surgical history on file.   Current Meds  Medication Sig  . acetaminophen (TYLENOL) 500 MG tablet Take 1 tablet (500 mg total) every 6 (six) hours as needed by mouth.  Marland Kitchen amLODipine (NORVASC) 10 MG tablet Take 1 tablet (10 mg total) by mouth daily.  . carvedilol (  COREG) 25 MG tablet Take 1 tablet (25 mg total) by mouth 2 (two) times daily with a meal.  . doxazosin (CARDURA) 2 MG tablet Take 1 tablet (2 mg total) by mouth daily.  . hydrALAZINE (APRESOLINE) 25 MG tablet Take 1 tablet (25 mg total) by mouth 2 (two) times daily.  Marland Kitchen lisinopril (ZESTRIL) 40 MG tablet Take 1 tablet (40 mg total) by mouth daily.     Allergies:   Patient has no known allergies.   Social History   Tobacco Use  . Smoking status: Never Smoker  . Smokeless tobacco: Never  Used  Substance Use Topics  . Alcohol use: No  . Drug use: No     Family Hx: The patient's family history includes Diabetes in his father; Hypertension in his father, mother, and sister.  ROS:   Please see the history of present illness.     All other systems reviewed and are negative.   Prior CV studies:   The following studies were reviewed today:    Labs/Other Tests and Data Reviewed:    EKG:  No ECG reviewed.  Recent Labs: No results found for requested labs within last 8760 hours.   Recent Lipid Panel Lab Results  Component Value Date/Time   CHOL 162 05/02/2018 04:35 PM   TRIG 109 05/02/2018 04:35 PM   HDL 32 (L) 05/02/2018 04:35 PM   CHOLHDL 5.1 (H) 05/02/2018 04:35 PM   CHOLHDL 5.1 (H) 04/11/2016 10:17 AM   LDLCALC 108 (H) 05/02/2018 04:35 PM    Wt Readings from Last 3 Encounters:  08/07/19 (!) 365 lb (165.6 kg)  04/18/19 (!) 365 lb (165.6 kg)  12/27/18 (!) 350 lb (158.8 kg)     Objective:    Vital Signs:  BP (!) 166/98   Pulse 73   Ht 6\' 6"  (1.981 m)   Wt (!) 365 lb (165.6 kg)   BMI 42.18 kg/m  146/96  VITAL SIGNS:  reviewed GEN:  no acute distress RESPIRATORY:  normal respiratory effort NEURO:  alert and oriented x 3 PSYCH:  normal affect  ASSESSMENT & PLAN:    Hypertension-medical therapy includes amlodipine 10 mg daily, carvedilol 25 mg twice daily, currently on doxazosin 2 mg daily, and on hydralazine 25 mg twice daily as well as lisinopril 40 mg daily.  We will need to continue to uptitrate his therapy which we have been doing and monitoring closely over the past year.  At this time we will increase his doxazosin to 4 mg daily.  This will have likely a modest lowering of the blood pressure, and I have informed the patient that our next step would be to uptitrate this to 8 mg, which he can pursue with nephrology if his blood pressure remains elevated at that visit as well.  Our other strategy can be up titration of hydralazine.  We can also  consider adding a long-acting nitrate.  We still have several options which will not impact renal function.  Goal blood pressure 120/80 mmHg.  Hyperlipidemia-he was previously taking atorvastatin 20 mg daily but unfortunately refills were not coordinated appropriately, we will rectify this today and send in a prescription for atorvastatin 20 mg daily.  Paroxysmal SVT-no additional symptoms of palpitations.  On carvedilol.  History of OSA-compliant with CPAP, continue.  Chronic kidney disease stage III-followed by nephrology, appointment with Dr. upcoming.    COVID-19 Education: The signs and symptoms of COVID-19 were discussed with the patient and how to seek care for testing (follow  up with PCP or arrange E-visit).  The importance of social distancing was discussed today.  Time:   Today, I have spent 30 minutes on today's encounter, with >50% of that time with the patient with telehealth technology discussing the above problems.     Medication Adjustments/Labs and Tests Ordered: Current medicines are reviewed at length with the patient today.  Concerns regarding medicines are outlined above.   Tests Ordered: No orders of the defined types were placed in this encounter.   Medication Changes: No orders of the defined types were placed in this encounter.   Follow Up:  Either In Person or Virtual in 3 month(s)  Signed, Elouise Munroe, MD  08/07/2019 8:28 AM    [place]

## 2019-08-07 NOTE — Patient Instructions (Signed)
Medication Instructions:  INCREASE DOXAZOSIN TO 4MG  DAILY *If you need a refill on your cardiac medications before your next appointment, please call your pharmacy   Follow-Up: At [facility], you and your health needs are our priority.  As part of our continuing mission to provide you with exceptional heart care, we have created designated Provider Care Teams.  These Care Teams include your primary Cardiologist (physician) and Advanced Practice Providers (APPs -  Physician Assistants and Nurse Practitioners) who all work together to provide you with the care you need, when you need it.  Your next appointment:   3 month(s)  The format for your next appointment:   Either In Person or Virtual  Provider:   [facility], MD

## 2019-08-21 ENCOUNTER — Other Ambulatory Visit: Payer: Self-pay | Admitting: Internal Medicine

## 2019-09-02 ENCOUNTER — Ambulatory Visit: Payer: BC Managed Care – PPO | Attending: Family

## 2019-09-02 DIAGNOSIS — Z23 Encounter for immunization: Secondary | ICD-10-CM

## 2019-09-02 NOTE — Progress Notes (Signed)
   [id number]  Name:  Derrick Cain.    MRN: [id number] DOB: 1970/10/25  09/02/2019  [name] was observed post Covid-19 immunization for 15 minutes without incidence. He was provided with Vaccine Information Sheet and instruction to access the V-Safe system.   [name] was instructed to call 911 with any severe reactions post vaccine: Marland Kitchen Difficulty breathing  . Swelling of your face and throat  . A fast heartbeat  . A bad rash all over your body  . Dizziness and weakness    Immunizations Administered    Name Date Dose VIS Date Route   Moderna COVID-19 Vaccine 09/02/2019  2:08 PM 0.5 mL 06/11/2019 Intramuscular   Manufacturer: Moderna   Lot: [id number]   NDC: 99806-999-67

## 2019-09-03 ENCOUNTER — Encounter: Payer: Self-pay | Admitting: Adult Health

## 2019-09-03 ENCOUNTER — Ambulatory Visit: Payer: BC Managed Care – PPO | Admitting: Adult Health

## 2019-09-03 ENCOUNTER — Other Ambulatory Visit: Payer: Self-pay

## 2019-09-03 VITALS — BP 161/94 | HR 62 | Temp 97.7°F | Ht 78.0 in | Wt 358.0 lb

## 2019-09-03 DIAGNOSIS — Z9989 Dependence on other enabling machines and devices: Secondary | ICD-10-CM

## 2019-09-03 DIAGNOSIS — G4733 Obstructive sleep apnea (adult) (pediatric): Secondary | ICD-10-CM | POA: Diagnosis not present

## 2019-09-03 NOTE — Progress Notes (Signed)
PATIENT: Derrick Cain. DOB: [birth year]  REASON FOR VISIT: follow up HISTORY FROM: patient  HISTORY OF PRESENT ILLNESS: Today 09/03/19:  Mr. Mostafa is a 49 year old male with a history of obstructive sleep apnea on CPAP.  His download indicates that he used his machine 30 out of 30 days for compliance of 100%.  He uses machine greater than 4 hours 23 days for compliance of 77%.  On average he uses his machine 5 hours and 23 minutes.  His residual AHI is 3.1 on 7 to 17 cm of water with EPR 3.  He does not have a significant leak.  He returns today for evaluation.  HISTORY (Copied from Dr.Dohmeier's note)  Tanyon Alipio. is a 49 y.o. male patient , seen in a RV on 08-28-2018.  Mr. Gentile presented today with very high BP, just changed to Carvedilol from Metoprolol. His nephrologist is Dr.Webb,Cardiologist is Dr. Jacques Navy. No headaches, no report of faintness or lightheadedness.  The patient was seen here for CPAP compliance visit he is using a autotitrator with a minimum pressure of 6 maximum pressure of 15 cmH2O and 3 cm EPR, he has used the machine 97% of days but only 40% of those that he actually use it over 4 hours.  His average user time is only 3 hours 56 minutes.  His residual AHI is 4.5, most apneas are obstructive in nature, his 95th percentile is 12.2 cmH2O.  He does not have major air leakage and he endorsed an Epworth sleepiness score of only 5 points fatigue severity at 29 points  Based on these data I think we need to increase his maximum pressure by 2 cmH2O I would also like for him to try to get to the 4-hour mark each night.  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   REVIEW OF SYSTEMS: Out of a complete 14 system review of symptoms, the patient complains only of the following symptoms, and all other reviewed systems are negative.  Epworth sleepiness score 4 Fatigue severity score 10  ALLERGIES: No Known Allergies  HOME MEDICATIONS: Outpatient  Medications Prior to Visit  Medication Sig Dispense Refill  . acetaminophen (TYLENOL) 500 MG tablet Take 1 tablet (500 mg total) every 6 (six) hours as needed by mouth. 30 tablet 0  . amLODipine (NORVASC) 10 MG tablet Take 1 tablet (10 mg total) by mouth daily. 90 tablet 3  . atorvastatin (LIPITOR) 20 MG tablet Take 1 tablet (20 mg total) by mouth daily. 90 tablet 3  . carvedilol (COREG) 25 MG tablet Take 1 tablet (25 mg total) by mouth 2 (two) times daily with a meal. 180 tablet 2  . doxazosin (CARDURA) 4 MG tablet Take 1 tablet (4 mg total) by mouth daily. 90 tablet 3  . hydrALAZINE (APRESOLINE) 25 MG tablet Take 1 tablet (25 mg total) by mouth 2 (two) times daily. 180 tablet 3  . lisinopril (ZESTRIL) 40 MG tablet Take 1 tablet (40 mg total) by mouth daily. 90 tablet 3   No facility-administered medications prior to visit.    PAST MEDICAL HISTORY: Past Medical History:  Diagnosis Date  . Hypertension     PAST SURGICAL HISTORY: History reviewed. No pertinent surgical history.  FAMILY HISTORY: Family History  Problem Relation Age of Onset  . Hypertension Mother   . Diabetes Father   . Hypertension Father   . Hypertension Sister     SOCIAL HISTORY: Social History   Socioeconomic History  .  Marital status: Married    Spouse name: Not on file  . Number of children: Not on file  . Years of education: Not on file  . Highest education level: Not on file  Occupational History  . Occupation: Sports administrator: [employer]  Tobacco Use  . Smoking status: Never Smoker  . Smokeless tobacco: Never Used  Substance and Sexual Activity  . Alcohol use: No  . Drug use: No  . Sexual activity: Yes  Other Topics Concern  . Not on file  Social History Narrative   Married with children. Education: [employer].    Social Determinants of Health   Financial Resource Strain:   . Difficulty of Paying Living Expenses: Not on file  Food Insecurity:   . Worried About Programme researcher, broadcasting/film/video in the  Last Year: Not on file  . Ran Out of Food in the Last Year: Not on file  Transportation Needs:   . Lack of Transportation (Medical): Not on file  . Lack of Transportation (Non-Medical): Not on file  Physical Activity:   . Days of Exercise per Week: Not on file  . Minutes of Exercise per Session: Not on file  Stress:   . Feeling of Stress : Not on file  Social Connections:   . Frequency of Communication with Friends and Family: Not on file  . Frequency of Social Gatherings with Friends and Family: Not on file  . Attends Religious Services: Not on file  . Active Member of Clubs or Organizations: Not on file  . Attends Banker Meetings: Not on file  . Marital Status: Not on file  Intimate Partner Violence:   . Fear of Current or Ex-Partner: Not on file  . Emotionally Abused: Not on file  . Physically Abused: Not on file  . Sexually Abused: Not on file      PHYSICAL EXAM  Vitals:   09/03/19 1448  BP: (!) 161/94  Pulse: 62  Temp: 97.7 F (36.5 C)  TempSrc: Oral  Weight: (!) 358 lb (162.4 kg)  Height: 6\' 6"  (1.981 m)   Body mass index is 41.37 kg/m.  Generalized: Well developed, in no acute distress  Chest: Lungs clear to auscultation bilaterally  Neurological examination  Mentation: Alert oriented to time, place, history taking. Follows all commands speech and language fluent Cranial nerve II-XII: Extraocular movements were full, visual field were full on confrontational test Head turning and shoulder shrug  were normal and symmetric. Motor: The motor testing reveals 5 over 5 strength of all 4 extremities. Good symmetric motor tone is noted throughout.  Sensory: Sensory testing is intact to soft touch on all 4 extremities. No evidence of extinction is noted.  Gait and station: Gait is normal.    DIAGNOSTIC DATA (LABS, IMAGING, TESTING) - I reviewed patient records, labs, notes, testing and imaging myself where available.  Lab Results  Component Value  Date   WBC 5.4 05/02/2018   HGB 14.3 05/02/2018   HCT 42.2 05/02/2018   MCV 79 05/02/2018   PLT 205 05/02/2018      Component Value Date/Time   NA 144 05/02/2018 1635   K 4.4 05/02/2018 1635   CL 105 05/02/2018 1635   CO2 25 05/02/2018 1635   GLUCOSE 94 05/02/2018 1635   GLUCOSE 78 03/07/2016 1413   BUN 14 05/02/2018 1635   CREATININE 1.74 (H) 05/02/2018 1635   CREATININE 1.67 (H) 03/07/2016 1413   CALCIUM 9.4 05/02/2018 1635   PROT 7.1 05/02/2018  1635   ALBUMIN 4.1 05/02/2018 1635   AST 24 05/02/2018 1635   ALT 33 05/02/2018 1635   ALKPHOS 105 05/02/2018 1635   BILITOT 0.7 05/02/2018 1635   GFRNONAA 46 (L) 05/02/2018 1635   GFRNONAA 49 (L) 03/07/2016 1413   GFRAA 53 (L) 05/02/2018 1635   GFRAA 57 (L) 03/07/2016 1413   Lab Results  Component Value Date   CHOL 162 05/02/2018   HDL 32 (L) 05/02/2018   LDLCALC 108 (H) 05/02/2018   TRIG 109 05/02/2018   CHOLHDL 5.1 (H) 05/02/2018   Lab Results  Component Value Date   HGBA1C 5.8 (H) 04/11/2016   No results found for: [id number] Lab Results  Component Value Date   TSH 1.060 05/02/2018      ASSESSMENT AND PLAN 49 y.o. year old male  has a past medical history of Hypertension. here with:  1. Obstructive sleep apnea on CPAP  The patient's CPAP download shows excellent compliance and good treatment of his apnea.  He is encouraged to continue using CPAP nightly and greater than 4 hours each night.  He is advised that if his symptoms worsen or he develops new symptoms he should let us know.  He will follow-up in 1 year or sooner if needed  I spent 15 minutes with the patient. 50% of this time was spent reviewing CPAP download   Ward Givens, MSN, NP-C 09/03/2019, 3:47 PM [facility] [address], [place], [place] [address] [phone]

## 2019-09-03 NOTE — Patient Instructions (Signed)
Continue using CPAP nightly and greater than 4 hours each night °If your symptoms worsen or you develop new symptoms please let us know.  ° °

## 2019-10-08 ENCOUNTER — Ambulatory Visit: Payer: BC Managed Care – PPO | Attending: Family

## 2019-10-08 DIAGNOSIS — Z23 Encounter for immunization: Secondary | ICD-10-CM

## 2019-10-08 NOTE — Progress Notes (Signed)
   [id number]  Name:  Derrick Cain.    MRN: [id number] DOB: 1971-01-16  10/08/2019  [name] was observed post Covid-19 immunization for 15 minutes without incident. He was provided with Vaccine Information Sheet and instruction to access the V-Safe system.   Mr. Say was instructed to call 911 with any severe reactions post vaccine: Marland Kitchen Difficulty breathing  . Swelling of face and throat  . A fast heartbeat  . A bad rash all over body  . Dizziness and weakness   Immunizations Administered    Name Date Dose VIS Date Route   Moderna COVID-19 Vaccine 10/08/2019  1:37 PM 0.5 mL 06/11/2019 Intramuscular   Manufacturer: Moderna   Lot: [id number]   NDC: 61548-845-73

## 2019-11-06 ENCOUNTER — Ambulatory Visit (INDEPENDENT_AMBULATORY_CARE_PROVIDER_SITE_OTHER): Payer: BC Managed Care – PPO | Admitting: Internal Medicine

## 2019-11-06 ENCOUNTER — Other Ambulatory Visit: Payer: Self-pay

## 2019-11-06 ENCOUNTER — Encounter: Payer: Self-pay | Admitting: Internal Medicine

## 2019-11-06 VITALS — BP 160/92 | HR 64 | Ht 78.0 in | Wt 359.4 lb

## 2019-11-06 DIAGNOSIS — I471 Supraventricular tachycardia: Secondary | ICD-10-CM

## 2019-11-06 DIAGNOSIS — R7303 Prediabetes: Secondary | ICD-10-CM

## 2019-11-06 DIAGNOSIS — N183 Chronic kidney disease, stage 3 unspecified: Secondary | ICD-10-CM

## 2019-11-06 DIAGNOSIS — G4733 Obstructive sleep apnea (adult) (pediatric): Secondary | ICD-10-CM | POA: Diagnosis not present

## 2019-11-06 DIAGNOSIS — R002 Palpitations: Secondary | ICD-10-CM

## 2019-11-06 DIAGNOSIS — I10 Essential (primary) hypertension: Secondary | ICD-10-CM

## 2019-11-06 DIAGNOSIS — Z9989 Dependence on other enabling machines and devices: Secondary | ICD-10-CM

## 2019-11-06 NOTE — Progress Notes (Signed)
Cardiology Office Note:    Date:  11/06/2019   ID:  Derrick Southward., DOB 1971-03-19, MRN [id number]  PCP:  [facility]  Cardiologist:  Elouise Munroe, MD   Electrophysiologist:  None   Referring MD: [facility] *   Chief Complaint: Follow-up hypertension  History of Present Illness:    Derrick Boeke. is a 49 y.o. male with a history of hypertension, chronic renal insufficiency, gout, obesity.  The patient denies last met in January 2021 for continued management of hypertension.  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  He does feel that his resting blood pressure is lower than what is reflected on office evaluations.  He is struggling with weight management, and we have discussed healthy weight and wellness referral.  The mental stress of his job he feels is taking a toll.  He recently visited with nephrology who made no changes in his therapy.  He denies chest pain, shortness of breath, palpitations, lower extremity swelling, syncope.  Past Medical History:  Diagnosis Date  . Hypertension     No past surgical history on file.  Current Medications: Current Meds  Medication Sig  . acetaminophen (TYLENOL) 500 MG tablet Take 1 tablet (500 mg total) every 6 (six) hours as needed by mouth.  Marland Kitchen amLODipine (NORVASC) 10 MG tablet Take 1 tablet (10 mg total) by mouth daily.  Marland Kitchen atorvastatin (LIPITOR) 20 MG tablet Take 1 tablet (20 mg total) by mouth daily.  Marland Kitchen doxazosin (CARDURA) 4 MG tablet Take 1 tablet (4 mg total) by mouth daily.  . hydrALAZINE (APRESOLINE) 25 MG tablet Take 1 tablet (25 mg total) by mouth 2 (two) times daily.  Marland Kitchen lisinopril (ZESTRIL) 40 MG tablet Take 1 tablet (40 mg total) by mouth daily.  . [DISCONTINUED] carvedilol (COREG) 25 MG tablet Take 1 tablet (25 mg total) by mouth 2 (two) times daily with  a meal.     Allergies:   Patient has no known allergies.   Social History   Socioeconomic History  . Marital status: Married    Spouse name: Not on file  . Number of children: Not on file  . Years of education: Not on file  . Highest education level: Not on file  Occupational History  . Occupation: Music therapist: [employer]  Tobacco Use  . Smoking status: Never Smoker  . Smokeless tobacco: Never Used  Substance and Sexual Activity  . Alcohol use: No  . Drug use: No  . Sexual activity: Yes  Other Topics Concern  . Not on file  Social History Narrative   Married with children. Education: [employer].    Social Determinants of Health   Financial Resource Strain:   . Difficulty of Paying Living Expenses:   Food Insecurity:   . Worried About Charity fundraiser in the Last Year:   . Arboriculturist in the Last Year:   Transportation Needs:   . Film/video editor (Medical):   Marland Kitchen Lack of Transportation (Non-Medical):   Physical Activity:   . Days of Exercise per Week:   . Minutes of Exercise per Session:   Stress:   . Feeling of Stress :   Social Connections:   . Frequency of Communication with Friends and Family:   . Frequency of Social Gatherings with Friends and Family:   .  Attends Religious Services:   . Active Member of Clubs or Organizations:   . Attends Archivist Meetings:   Marland Kitchen Marital Status:      Family History: The patient's family history includes Diabetes in his father; Hypertension in his father, mother, and sister.  ROS:   Please see the history of present illness.    All other systems reviewed and are negative.  EKGs/Labs/Other Studies Reviewed:    The following studies were reviewed today:  EKG: Sinus rhythm with PACs  Recent Labs: No results found for requested labs within last 8760 hours.  Recent Lipid Panel    Component Value Date/Time   CHOL 162 05/02/2018 1635   TRIG 109 05/02/2018 1635   HDL 32 (L) 05/02/2018 1635    CHOLHDL 5.1 (H) 05/02/2018 1635   CHOLHDL 5.1 (H) 04/11/2016 1017   VLDL 26 04/11/2016 1017   LDLCALC 108 (H) 05/02/2018 1635    Physical Exam:    VS:  BP (!) 160/92   Pulse 64   Ht '6\' 6"'$  (1.981 m)   Wt (!) 359 lb 6.4 oz (163 kg)   SpO2 98%   BMI 41.53 kg/m     Wt Readings from Last 5 Encounters:  11/06/19 (!) 359 lb 6.4 oz (163 kg)  09/03/19 (!) 358 lb (162.4 kg)  08/07/19 (!) 365 lb (165.6 kg)  04/18/19 (!) 365 lb (165.6 kg)  12/27/18 (!) 350 lb (158.8 kg)     Constitutional: No acute distress Eyes: sclera non-icteric, normal conjunctiva and lids ENMT: Mask Cardiovascular: regular rhythm, normal rate, no murmurs. S1 and S2 normal. Radial pulses normal bilaterally. No jugular venous distention.  Respiratory: clear to auscultation bilaterally GI : normal bowel sounds, soft and nontender. No distention.   MSK: extremities warm, well perfused. No edema.  NEURO: grossly nonfocal exam, moves all extremities. PSYCH: alert and oriented x 3, normal mood and affect.   ASSESSMENT:    1. Essential hypertension   2. Obesity, Class III, BMI 40-49.9 (morbid obesity) ([place])   3. SVT (supraventricular tachycardia) ([place])   4. OSA on CPAP   5. Stage 3 chronic kidney disease, unspecified whether stage 3a or 3b CKD   6. Palpitations   7. Prediabetes    PLAN:    Essential hypertension -his blood pressure is quite elevated, however he feels this is significantly impacted by situational stressors.  Was seen by nephrology quite recently, with no changes to therapy.  Reasonable to observe at this time.  Patient has been given guidelines on when to contact us if blood pressure remains in the 119J to 478G systolic so that we can adjust his therapy.  My next step would be to increase his doxazosin to 8 mg daily.  He will contact us if his blood pressure remains elevated at home.  Obesity, Class III, BMI 40-49.9 (morbid obesity) ([place]) -  Plan: Amb Ref to Medical Weight Management Referral made  to healthy weight and wellness to assist the patient in strategies to lose weight which will help with his blood pressure as well as his diabetes control.  SVT (supraventricular tachycardia) ([facility]) -no recent palpitations or concerning symptoms.  Continues on carvedilol.  OSA on CPAP-encourage compliance.  Stage 3 chronic kidney disease, unspecified whether stage 3a or 3b CKD-followed by nephrology  Hyperlipidemia-needs repeat lipid panel at next visit, or with primary care at next physical.  Total time of encounter: 30 minutes total time of encounter, including 17 minutes spent in face-to-face patient care on  the date of this encounter. This time includes coordination of care and counseling regarding above mentioned problem list. Remainder of non-face-to-face time involved reviewing chart documents/testing relevant to the patient encounter and documentation in the medical record. I have independently reviewed documentation from referring provider.   Cherlynn Kaiser, MD [place]  [facility]    Medication Adjustments/Labs and Tests Ordered: Current medicines are reviewed at length with the patient today.  Concerns regarding medicines are outlined above.  Orders Placed This Encounter  Procedures  . Amb Ref to Medical Weight Management  . EKG 12-Lead   No orders of the defined types were placed in this encounter.   Patient Instructions  Medication Instructions:   Your physician recommends that you continue on your current medications as directed. Please refer to the Current Medication list given to you today.  *If you need a refill on your cardiac medications before your next appointment, please call your pharmacy*  Lab Work:  None ordered today  Testing/Procedures:  None ordered today  Follow-Up: At [facility], you and your health needs are our priority.  As part of our continuing mission to provide you with exceptional heart care, we have created designated  Provider Care Teams.  These Care Teams include your primary Cardiologist (physician) and Advanced Practice Providers (APPs -  Physician Assistants and Nurse Practitioners) who all work together to provide you with the care you need, when you need it.  We recommend signing up for the patient portal called "MyChart".  Sign up information is provided on this After Visit Summary.  MyChart is used to connect with patients for Virtual Visits (Telemedicine).  Patients are able to view lab/test results, encounter notes, upcoming appointments, etc.  Non-urgent messages can be sent to your provider as well.   To learn more about what you can do with MyChart, go to [email or web address].    Your next appointment:   6 month(s)  The format for your next appointment:   In Person  Provider:   You may see Elouise Munroe, MD or one of the following Advanced Practice Providers on your designated Care Team:    Rosaria Ferries, PA-C  Jory Sims, DNP, ANP  Cadence Kathlen Mody, NP  Other Instructions  A referral has been started to [facility] [place]

## 2019-11-06 NOTE — Patient Instructions (Signed)
Medication Instructions:   Your physician recommends that you continue on your current medications as directed. Please refer to the Current Medication list given to you today.  *If you need a refill on your cardiac medications before your next appointment, please call your pharmacy*  Lab Work:  None ordered today  Testing/Procedures:  None ordered today  Follow-Up: At [facility], you and your health needs are our priority.  As part of our continuing mission to provide you with exceptional heart care, we have created designated Provider Care Teams.  These Care Teams include your primary Cardiologist (physician) and Advanced Practice Providers (APPs -  Physician Assistants and Nurse Practitioners) who all work together to provide you with the care you need, when you need it.  We recommend signing up for the patient portal called "MyChart".  Sign up information is provided on this After Visit Summary.  MyChart is used to connect with patients for Virtual Visits (Telemedicine).  Patients are able to view lab/test results, encounter notes, upcoming appointments, etc.  Non-urgent messages can be sent to your provider as well.   To learn more about what you can do with MyChart, go to [email or web address].    Your next appointment:   6 month(s)  The format for your next appointment:   In Person  Provider:   You may see Parke Poisson, MD or one of the following Advanced Practice Providers on your designated Care Team:    Theodore Demark, PA-C  Joni Reining, DNP, ANP  Cadence Fransico Michael, NP  Other Instructions  A referral has been started to [facility]

## 2019-11-12 ENCOUNTER — Other Ambulatory Visit: Payer: Self-pay

## 2019-11-12 MED ORDER — CARVEDILOL 25 MG PO TABS: 25.0000 mg | ORAL_TABLET | Freq: Two times a day (BID) | ORAL | 2 refills | Status: DC

## 2020-01-19 ENCOUNTER — Other Ambulatory Visit: Payer: Self-pay | Admitting: Internal Medicine

## 2020-01-19 DIAGNOSIS — I10 Essential (primary) hypertension: Secondary | ICD-10-CM

## 2020-02-14 DIAGNOSIS — I10 Essential (primary) hypertension: Secondary | ICD-10-CM

## 2020-02-14 MED ORDER — LISINOPRIL 40 MG PO TABS: 40.0000 mg | ORAL_TABLET | Freq: Every day | ORAL | 0 refills | Status: DC

## 2020-02-14 MED ORDER — AMLODIPINE BESYLATE 10 MG PO TABS: 10.0000 mg | ORAL_TABLET | Freq: Every day | ORAL | 0 refills | Status: DC

## 2020-04-24 ENCOUNTER — Other Ambulatory Visit: Payer: Self-pay | Admitting: Adult Health

## 2020-04-28 ENCOUNTER — Other Ambulatory Visit: Payer: Self-pay | Admitting: Adult Health

## 2020-04-28 NOTE — Telephone Encounter (Signed)
Rx has been sent to the pharmacy electronically. ° °

## 2020-05-04 ENCOUNTER — Ambulatory Visit: Payer: BC Managed Care – PPO | Admitting: Internal Medicine

## 2020-05-15 ENCOUNTER — Other Ambulatory Visit: Payer: Self-pay | Admitting: Internal Medicine

## 2020-05-15 DIAGNOSIS — I10 Essential (primary) hypertension: Secondary | ICD-10-CM

## 2020-05-18 ENCOUNTER — Encounter: Payer: Self-pay | Admitting: Internal Medicine

## 2020-05-18 ENCOUNTER — Other Ambulatory Visit: Payer: Self-pay

## 2020-05-18 ENCOUNTER — Ambulatory Visit (INDEPENDENT_AMBULATORY_CARE_PROVIDER_SITE_OTHER): Payer: BC Managed Care – PPO | Admitting: Internal Medicine

## 2020-05-18 VITALS — BP 160/102 | HR 62 | Ht 78.0 in | Wt 355.0 lb

## 2020-05-18 DIAGNOSIS — N183 Chronic kidney disease, stage 3 unspecified: Secondary | ICD-10-CM | POA: Diagnosis not present

## 2020-05-18 DIAGNOSIS — E7800 Pure hypercholesterolemia, unspecified: Secondary | ICD-10-CM | POA: Diagnosis not present

## 2020-05-18 DIAGNOSIS — G4733 Obstructive sleep apnea (adult) (pediatric): Secondary | ICD-10-CM

## 2020-05-18 DIAGNOSIS — Z79899 Other long term (current) drug therapy: Secondary | ICD-10-CM

## 2020-05-18 DIAGNOSIS — Z9989 Dependence on other enabling machines and devices: Secondary | ICD-10-CM

## 2020-05-18 DIAGNOSIS — I10 Essential (primary) hypertension: Secondary | ICD-10-CM

## 2020-05-18 MED ORDER — DOXAZOSIN MESYLATE 8 MG PO TABS: 8.0000 mg | ORAL_TABLET | Freq: Every day | ORAL | 4 refills | Status: DC

## 2020-05-18 NOTE — Patient Instructions (Signed)
Medication Instructions:  INCREASE DOXAZOSIN (CARDURA) to 8mg  (1 Tablet DAILY)- PLEASE PICK  THIS UP AT THE PHARMACY  *If you need a refill on your cardiac medications before your next appointment, please call your pharmacy*  Lab Work: None Ordered At This Time.  If you have labs (blood work) drawn today and your tests are completely normal, you will receive your results only by: MyChart Message (if you have MyChart) OR . A paper copy in the mail If you have any lab test that is abnormal or we need to change your treatment, we will call you to review the results.  Testing/Procedures: None Ordered At This Time.   Follow-Up: At [facility], you and your health needs are our priority.  As part of our continuing mission to provide you with exceptional heart care, we have created designated Provider Care Teams.  These Care Teams include your primary Cardiologist (physician) and Advanced Practice Providers (APPs -  Physician Assistants and Nurse Practitioners) who all work together to provide you with the care you need, when you need it.   Your next appointment:   3 month(s)  The format for your next appointment:   In Person  Provider:   [facility], MD

## 2020-05-18 NOTE — Progress Notes (Signed)
Cardiology Office Note:    Date:  05/18/2020   ID:  Derrick Southward., DOB Jun 29, 1971, MRN [id number]  PCP:  [facility]  Cardiologist:  Elouise Munroe, MD  Electrophysiologist:  None   Referring MD: [facility] *   Chief Complaint/Reason for Referral: HTN, CKD  History of Present Illness:    Derrick Hewes. is a 49 y.o. male with a history of hypertension, chronic renal insufficiency, gout, obesity. The patient and I last met in April 2021 for continued management of hypertension.   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  He does feel that his resting blood pressure is lower than what is reflected on office evaluations.  He is struggling with weight management, and we have discussed healthy weight and wellness referral.  The mental stress of his job he feels is taking a toll.  He recently visited with nephrology who made no changes in his therapy.   He denies chest pain, shortness of breath, palpitations, syncope. Mild lower extremity swelling at times.   Past Medical History:  Diagnosis Date  . Hypertension     No past surgical history on file.  Current Medications: Current Meds  Medication Sig  . acetaminophen (TYLENOL) 500 MG tablet Take 1 tablet (500 mg total) every 6 (six) hours as needed by mouth.  Marland Kitchen amLODipine (NORVASC) 10 MG tablet Take 1 tablet (10 mg total) by mouth daily.  Marland Kitchen atorvastatin (LIPITOR) 20 MG tablet Take 1 tablet (20 mg total) by mouth daily.  Marland Kitchen doxazosin (CARDURA) 4 MG tablet Take 1 tablet (4 mg total) by mouth daily.  . hydrALAZINE (APRESOLINE) 25 MG tablet TAKE 1 TABLET BY MOUTH TWICE A DAY  . lisinopril (ZESTRIL) 40 MG tablet Take 1 tablet (40 mg total) by mouth daily.     Allergies:   Patient has no known allergies.   Social History   Tobacco Use  . Smoking status: Never  Smoker  . Smokeless tobacco: Never Used  Vaping Use  . Vaping Use: Never used  Substance Use Topics  . Alcohol use: No  . Drug use: No     Family History: The patient's family history includes Diabetes in his father; Hypertension in his father, mother, and sister.  ROS:   Please see the history of present illness.    All other systems reviewed and are negative.  EKGs/Labs/Other Studies Reviewed:    The following studies were reviewed today:  EKG:  SR, PACs in bigeminy  Recent Labs: No results found for requested labs within last 8760 hours.  Recent Lipid Panel    Component Value Date/Time   CHOL 162 05/02/2018 1635   TRIG 109 05/02/2018 1635   HDL 32 (L) 05/02/2018 1635   CHOLHDL 5.1 (H) 05/02/2018 1635   CHOLHDL 5.1 (H) 04/11/2016 1017   VLDL 26 04/11/2016 1017   LDLCALC 108 (H) 05/02/2018 1635    Physical Exam:    VS:  BP (!) 160/98 (BP Location: Right Arm, Patient Position: Sitting)   Pulse 62   Ht 6' 6" (1.981 m)   Wt (!) 355 lb (161 kg)   SpO2 96%   BMI 41.02 kg/m     Wt Readings from Last 5 Encounters:  05/18/20 (!) 355 lb (161 kg)  11/06/19 (!) 359 lb 6.4 oz (163 kg)  09/03/19 (!) 358  lb (162.4 kg)  08/07/19 (!) 365 lb (165.6 kg)  04/18/19 (!) 365 lb (165.6 kg)    Constitutional: No acute distress Eyes: sclera non-icteric, normal conjunctiva and lids ENMT: normal dentition, moist mucous membranes Cardiovascular: regular rhythm, normal rate, no murmurs. S1 and S2 normal.  No jugular venous distention.  Respiratory: clear to auscultation bilaterally GI : normal bowel sounds, soft and nontender. No distention.   MSK: extremities warm, well perfused. No edema.  NEURO: grossly nonfocal exam, moves all extremities. PSYCH: alert and oriented x 3, normal mood and affect.   ASSESSMENT:    1. Essential hypertension   2. OSA on CPAP   3. Stage 3 chronic kidney disease, unspecified whether stage 3a or 3b CKD ([place])   4. Pure hypercholesterolemia   5.  Medication management    PLAN:    Essential hypertension - Plan: EKG 12-Lead, doxazosin (CARDURA) 8 MG tablet - will increase dose of doxazosin today. We discussed that his BP is persistently elevated at each office visit, and whether this is due to stress or actually represents pathology, it will likely need more aggressive treatment given repeated elevated BP in office.  AntiHTN regimen includes: ACE-I: lisinopril 40 mg daily CCB: amlodipine 10 mg daily BB: carvedilol 25 mg BID A-1 blockade: doxazosin, increase dose to 8 mg daily today. Vasodilator: hydralazine 25 mg BID  Pure hypercholesterolemia - on atorvastatin 20 mg daily. Needs lipids at next follow up.   Total time of encounter: 30 minutes total time of encounter, including 20 minutes spent in face-to-face patient care on the date of this encounter. This time includes coordination of care and counseling regarding above mentioned problem list. Remainder of non-face-to-face time involved reviewing chart documents/testing relevant to the patient encounter and documentation in the medical record. I have independently reviewed documentation from referring provider.   Cherlynn Kaiser, MD [place]  [facility]    Medication Adjustments/Labs and Tests Ordered: Current medicines are reviewed at length with the patient today.  Concerns regarding medicines are outlined above.   Orders Placed This Encounter  Procedures  . EKG 12-Lead    Meds ordered this encounter  Medications  . doxazosin (CARDURA) 8 MG tablet    Sig: Take 1 tablet (8 mg total) by mouth daily.    Dispense:  30 tablet    Refill:  4    Patient Instructions  Medication Instructions:  INCREASE DOXAZOSIN (CARDURA) to 53m (1 Tablet DAILY)- PLEASE PICK  THIS UP AT THE PHARMACY  *If you need a refill on your cardiac medications before your next appointment, please call your pharmacy*  Lab Work: None Ordered At This Time.  If you have labs (blood work) drawn  today and your tests are completely normal, you will receive your results only by: .Marland KitchenMyChart Message (if you have MyChart) OR . A paper copy in the mail If you have any lab test that is abnormal or we need to change your treatment, we will call you to review the results.  Testing/Procedures: None Ordered At This Time.   Follow-Up: [facility] our [facility].  As part of our continuing mission to provide you with exceptional heart care, we have created designated Provider Care Teams.  These Care Teams include your primary Cardiologist (physician) and Advanced Practice Providers (APPs -  Physician Assistants and Nurse Practitioners) who all work together to provide you with the care you need, when you need it.   Your next appointment:  3 month(s)  The format for your next appointment:   In Person  Provider:   Cherlynn Kaiser, MD

## 2020-05-19 ENCOUNTER — Other Ambulatory Visit: Payer: Self-pay

## 2020-05-19 DIAGNOSIS — I10 Essential (primary) hypertension: Secondary | ICD-10-CM

## 2020-05-19 MED ORDER — AMLODIPINE BESYLATE 10 MG PO TABS: 10.0000 mg | ORAL_TABLET | Freq: Every day | ORAL | 3 refills | Status: DC

## 2020-05-19 MED ORDER — LISINOPRIL 40 MG PO TABS: 40.0000 mg | ORAL_TABLET | Freq: Every day | ORAL | 3 refills | Status: DC

## 2020-07-23 ENCOUNTER — Other Ambulatory Visit: Payer: Self-pay | Admitting: Internal Medicine

## 2020-07-23 ENCOUNTER — Ambulatory Visit: Payer: BC Managed Care – PPO | Attending: Internal Medicine

## 2020-07-23 DIAGNOSIS — Z23 Encounter for immunization: Secondary | ICD-10-CM

## 2020-07-23 NOTE — Progress Notes (Signed)
   [id number]  Name:  Lanson Randle.    MRN: [id number] DOB: February 13, 1971  07/23/2020  Mr. Petz was observed post Covid-19 immunization for 15 minutes without incident. He was provided with Vaccine Information Sheet and instruction to access the V-Safe system.   Mr. Worton was instructed to call 911 with any severe reactions post vaccine: Marland Kitchen Difficulty breathing  . Swelling of face and throat  . A fast heartbeat  . A bad rash all over body  . Dizziness and weakness   Immunizations Administered    Name Date Dose VIS Date Route   Moderna Covid-19 Booster Vaccine 07/23/2020  1:42 PM 0.25 mL 04/29/2020 Intramuscular   Manufacturer: Moderna   Lot: [id number]   NDC: 50277-412-87

## 2020-07-25 ENCOUNTER — Other Ambulatory Visit: Payer: Self-pay | Admitting: Internal Medicine

## 2020-08-05 ENCOUNTER — Other Ambulatory Visit: Payer: Self-pay | Admitting: Internal Medicine

## 2020-08-10 ENCOUNTER — Other Ambulatory Visit: Payer: Self-pay

## 2020-08-10 MED ORDER — ATORVASTATIN CALCIUM 20 MG PO TABS
20.0000 mg | ORAL_TABLET | Freq: Every day | ORAL | 3 refills | Status: DC
Start: 2020-08-10 — End: 2021-08-04

## 2020-08-10 MED ORDER — CARVEDILOL 25 MG PO TABS: 25.0000 mg | ORAL_TABLET | Freq: Two times a day (BID) | ORAL | 3 refills | Status: DC

## 2020-08-20 ENCOUNTER — Ambulatory Visit: Payer: BC Managed Care – PPO | Admitting: Internal Medicine

## 2020-09-02 ENCOUNTER — Ambulatory Visit: Payer: BC Managed Care – PPO | Admitting: Adult Health

## 2020-09-29 ENCOUNTER — Other Ambulatory Visit: Payer: Self-pay | Admitting: Internal Medicine

## 2020-09-29 DIAGNOSIS — I10 Essential (primary) hypertension: Secondary | ICD-10-CM

## 2020-11-23 ENCOUNTER — Ambulatory Visit: Payer: BC Managed Care – PPO | Admitting: Internal Medicine

## 2020-12-02 DIAGNOSIS — I10 Essential (primary) hypertension: Secondary | ICD-10-CM

## 2020-12-02 MED ORDER — DOXAZOSIN MESYLATE 8 MG PO TABS: 8.0000 mg | ORAL_TABLET | Freq: Every day | ORAL | 3 refills | Status: DC

## 2020-12-15 ENCOUNTER — Other Ambulatory Visit: Payer: Self-pay

## 2020-12-15 ENCOUNTER — Ambulatory Visit (INDEPENDENT_AMBULATORY_CARE_PROVIDER_SITE_OTHER): Payer: BC Managed Care – PPO | Admitting: Internal Medicine

## 2020-12-15 ENCOUNTER — Encounter: Payer: Self-pay | Admitting: Internal Medicine

## 2020-12-15 VITALS — BP 158/94 | HR 62 | Ht 78.0 in | Wt 365.2 lb

## 2020-12-15 DIAGNOSIS — Z79899 Other long term (current) drug therapy: Secondary | ICD-10-CM

## 2020-12-15 DIAGNOSIS — I10 Essential (primary) hypertension: Secondary | ICD-10-CM

## 2020-12-15 DIAGNOSIS — N183 Chronic kidney disease, stage 3 unspecified: Secondary | ICD-10-CM

## 2020-12-15 DIAGNOSIS — G4733 Obstructive sleep apnea (adult) (pediatric): Secondary | ICD-10-CM | POA: Diagnosis not present

## 2020-12-15 DIAGNOSIS — Z9989 Dependence on other enabling machines and devices: Secondary | ICD-10-CM

## 2020-12-15 DIAGNOSIS — R002 Palpitations: Secondary | ICD-10-CM

## 2020-12-15 DIAGNOSIS — I471 Supraventricular tachycardia: Secondary | ICD-10-CM

## 2020-12-15 DIAGNOSIS — E7800 Pure hypercholesterolemia, unspecified: Secondary | ICD-10-CM

## 2020-12-15 MED ORDER — DOXAZOSIN MESYLATE 8 MG PO TABS: 16.0000 mg | ORAL_TABLET | Freq: Every day | ORAL | 6 refills | Status: DC

## 2020-12-15 NOTE — Progress Notes (Addendum)
Cardiology Office Note:    Date:  12/15/2020   ID:  Ulyses Southward., DOB 1971-03-16, MRN [id number]  PCP:  [facility]  Cardiologist:  Elouise Munroe, MD  Electrophysiologist:  None   Referring MD: [facility]   Chief Complaint/Reason for Referral: HTN  History of Present Illness:    Hicks Feick. is a 50 y.o. male with a history of hypertension, chronic renal insufficiency, gout, obesity. The patient and I last met in Nov 2021 for continued management of hypertension.   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  BP similar at home and elevated. Planning to reschedule follow up with Nephrology which he had to cancel when his wife had [place].   The patient denies chest pain, chest pressure, dyspnea at rest or with exertion, palpitations, PND, orthopnea, or leg swelling. Denies cough, fever, chills. Denies nausea, vomiting. Denies syncope or presyncope. Denies dizziness or lightheadedness.    Past Medical History:  Diagnosis Date  . Hypertension     No past surgical history on file.  Current Medications: Current Meds  Medication Sig  . acetaminophen (TYLENOL) 500 MG tablet Take 1 tablet (500 mg total) every 6 (six) hours as needed by mouth.  Marland Kitchen amLODipine (NORVASC) 10 MG tablet Take 1 tablet (10 mg total) by mouth daily.  Marland Kitchen atorvastatin (LIPITOR) 20 MG tablet Take 1 tablet (20 mg total) by mouth daily.  . carvedilol (COREG) 25 MG tablet Take 1 tablet (25 mg total) by mouth 2 (two) times daily with a meal.  . doxazosin (CARDURA) 8 MG tablet Take 1 tablet (8 mg total) by mouth daily.  . hydrALAZINE (APRESOLINE) 25 MG tablet TAKE 1 TABLET BY MOUTH TWICE A DAY  . lisinopril (ZESTRIL) 40 MG tablet Take 1 tablet (40 mg total) by mouth daily.     Allergies:   Patient has no known allergies.   Social History    Tobacco Use  . Smoking status: Never Smoker  . Smokeless tobacco: Never Used  Vaping Use  . Vaping Use: Never used  Substance Use Topics  . Alcohol use: No  . Drug use: No     Family History: The patient's family history includes Diabetes in his father; Hypertension in his father, mother, and sister.  ROS:   Please see the history of present illness.    All other systems reviewed and are negative.  EKGs/Labs/Other Studies Reviewed:    The following studies were reviewed today:  EKG:  NSR, incomplete RBBB  Recent Labs: No results found for requested labs within last 8760 hours.  Recent Lipid Panel    Component Value Date/Time   CHOL 162 05/02/2018 1635   TRIG 109 05/02/2018 1635   HDL 32 (L) 05/02/2018 1635   CHOLHDL 5.1 (H) 05/02/2018 1635   CHOLHDL 5.1 (H) 04/11/2016 1017   VLDL 26 04/11/2016 1017   LDLCALC 108 (H) 05/02/2018 1635    Physical Exam:    VS:  BP (!) 158/94 (BP Location: Left Arm, Patient Position: Sitting, Cuff Size: Large)   Pulse 62   Ht 6' 6"  (1.981 m)   Wt (!) 365 lb 3.2 oz (165.7 kg)   BMI 42.20 kg/m     Wt Readings from Last 5 Encounters:  12/15/20 (!) 365 lb 3.2 oz (165.7 kg)  05/18/20 (!) 355 lb (161  kg)  11/06/19 (!) 359 lb 6.4 oz (163 kg)  09/03/19 (!) 358 lb (162.4 kg)  08/07/19 (!) 365 lb (165.6 kg)    Constitutional: No acute distress Eyes: sclera non-icteric, normal conjunctiva and lids ENMT: normal dentition, moist mucous membranes Cardiovascular: regular rhythm, normal rate, no murmurs. S1 and S2 normal. Radial pulses normal bilaterally. No jugular venous distention.  Respiratory: clear to auscultation bilaterally GI : normal bowel sounds, soft and nontender. No distention.   MSK: extremities warm, well perfused. No edema.  NEURO: grossly nonfocal exam, moves all extremities. PSYCH: alert and oriented x 3, normal mood and affect.   ASSESSMENT:    1. Essential hypertension   2. Stage 3 chronic kidney disease,  unspecified whether stage 3a or 3b CKD ([place])   3. OSA on CPAP   4. Pure hypercholesterolemia   5. Medication management   6. Obesity, Class III, BMI 40-49.9 (morbid obesity) (HCC)   7. Palpitations   8. SVT (supraventricular tachycardia) (HCC)    PLAN:    Essential hypertension - Plan: EKG 12-Lead, doxazosin (CARDURA) 16 MG tablet - will increase dose of doxazosin today. We discussed that his BP is persistently elevated at each office visit, and whether this is due to stress or actually represents pathology, it will likely need more aggressive treatment given repeated elevated BP in office.  AntiHTN regimen includes: ACE-I: lisinopril 40 mg daily CCB: amlodipine 10 mg daily BB: carvedilol 25 mg BID A-1 blockade: doxazosin, increase dose to 16 mg daily today. Vasodilator: hydralazine 25 mg BID  - I wonder if he may do well on chlorthalidone or spironolactone. Defer to Dr. Crisoforo Oxford and will forward for his review.   Pure hypercholesterolemia - on atorvastatin 20 mg daily. Needs lipids at next follow up.    Total time of encounter: 30 minutes total time of encounter, including 20 minutes spent in face-to-face patient care on the date of this encounter. This time includes coordination of care and counseling regarding above mentioned problem list. Remainder of non-face-to-face time involved reviewing chart documents/testing relevant to the patient encounter and documentation in the medical record. I have independently reviewed documentation from referring provider.   Cherlynn Kaiser, MD, [facility]   Medication Adjustments/Labs and Tests Ordered: Current medicines are reviewed at length with the patient today.  Concerns regarding medicines are outlined above.   Orders Placed This Encounter  Procedures  . EKG 12-Lead    Meds ordered this encounter  Medications  . doxazosin (CARDURA) 8 MG tablet    Sig: Take 2 tablets (16 mg total) by mouth daily.     Dispense:  60 tablet    Refill:  6    Patient Instructions  Medication Instructions:  INCREASE DOXAZOSIN TO 67m (2 TABLETS) DAILY  *If you need a refill on your cardiac medications before your next appointment, please call your pharmacy*  Follow-Up: [facility] and your [facility] needs are our [facility].  As part of our continuing mission to provide you with exceptional heart care, we have created designated Provider Care Teams.  These Care Teams include your primary Cardiologist (physician) and Advanced Practice Providers (APPs -  Physician Assistants and Nurse Practitioners) who all work together to provide you with the care you need, when you need it.  Your next appointment:   November  The format for your next appointment:   In Person  Provider:   GCherlynn Kaiser MD

## 2020-12-15 NOTE — Patient Instructions (Signed)
Medication Instructions:  INCREASE DOXAZOSIN TO 16mg  (2 TABLETS) DAILY  *If you need a refill on your cardiac medications before your next appointment, please call your pharmacy*  Follow-Up: At [facility], you and your health needs are our priority.  As part of our continuing mission to provide you with exceptional heart care, we have created designated Provider Care Teams.  These Care Teams include your primary Cardiologist (physician) and Advanced Practice Providers (APPs -  Physician Assistants and Nurse Practitioners) who all work together to provide you with the care you need, when you need it.  Your next appointment:   November  The format for your next appointment:   In Person  Provider:   December, MD

## 2021-03-01 ENCOUNTER — Other Ambulatory Visit: Payer: Self-pay | Admitting: Internal Medicine

## 2021-03-01 DIAGNOSIS — I10 Essential (primary) hypertension: Secondary | ICD-10-CM

## 2021-04-07 ENCOUNTER — Other Ambulatory Visit: Payer: Self-pay | Admitting: Internal Medicine

## 2021-04-07 ENCOUNTER — Ambulatory Visit (INDEPENDENT_AMBULATORY_CARE_PROVIDER_SITE_OTHER): Payer: BC Managed Care – PPO

## 2021-04-07 ENCOUNTER — Ambulatory Visit
Admission: RE | Admit: 2021-04-07 | Discharge: 2021-04-07 | Disposition: A | Discharge status: Home / Self Care | Payer: BC Managed Care – PPO | Source: Ambulatory Visit | Attending: Internal Medicine | Admitting: Internal Medicine

## 2021-04-07 ENCOUNTER — Other Ambulatory Visit: Payer: Self-pay

## 2021-04-07 VITALS — BP 168/93 | HR 68 | Temp 97.4°F | Resp 18 | Ht 78.0 in | Wt 368.0 lb

## 2021-04-07 DIAGNOSIS — M25531 Pain in right wrist: Secondary | ICD-10-CM

## 2021-04-07 DIAGNOSIS — I10 Essential (primary) hypertension: Secondary | ICD-10-CM

## 2021-04-07 MED ORDER — PREDNISONE 20 MG PO TABS: 40.0000 mg | ORAL_TABLET | Freq: Every day | ORAL | 0 refills | Status: AC

## 2021-04-07 NOTE — ED Provider Notes (Addendum)
[facility]    CSN: [id number] Arrival date & time: 04/07/21  1251      History   Chief Complaint Chief Complaint  Patient presents with   Appointment    Right wrist pain    HPI Derrick Cain. is a 50 y.o. male.   Patient presents with right wrist pain that has been present for several weeks.  Denies any recent or past injury.  Patient does have history of gout and states that wrist pain "feels similar" to past gout flareups.  Patient is concerned because he has been having some numbness in the last 3 digits of his fingers of his right hand that is intermittent.  Has been wearing a wrist brace while at work.  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  Has been taking Tylenol with minimal improvement in pain. Denies fever.     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 No pertinent surgical history.     Home Medications    Prior to Admission medications   Medication Sig Start Date End Date Taking? Authorizing Provider  acetaminophen (TYLENOL) 500 MG tablet Take 1 tablet  (500 mg total) every 6 (six) hours as needed by mouth. 05/23/17  Yes Barnett Abu, [place] D, PA-C  amLODipine (NORVASC) 10 MG tablet Take 1 tablet (10 mg total) by mouth daily. 05/19/20  Yes Parke Poisson, MD  atorvastatin (LIPITOR) 20 MG tablet Take 1 tablet (20 mg total) by mouth daily. 08/10/20  Yes Parke Poisson, MD  doxazosin (CARDURA) 8 MG tablet Take 2 tablets (16 mg total) by mouth daily. 12/15/20  Yes Parke Poisson, MD  hydrALAZINE (APRESOLINE) 25 MG tablet TAKE 1 TABLET BY MOUTH TWICE A DAY 04/28/20  Yes Jodelle Gross, NP  lisinopril (ZESTRIL) 40 MG tablet Take 1 tablet (40 mg total) by mouth daily. 05/19/20  Yes Parke Poisson, MD  predniSONE (DELTASONE) 20 MG tablet Take 2 tablets (40 mg total) by mouth daily for 5 days. 04/07/21 04/12/21 Yes Lance Muss, FNP  carvedilol (COREG) 25 MG tablet Take 1 tablet (25 mg total) by mouth 2 (two) times daily with a meal. 08/10/20 11/08/20  Parke Poisson, MD    Family History Family History  Problem Relation Age of Onset   Hypertension Mother    Diabetes Father    Hypertension Father    Hypertension Sister     Social History Social History   Tobacco Use   Smoking status: Never   Smokeless tobacco: Never  Vaping Use   Vaping Use: Never used  Substance Use Topics  Alcohol use: No   Drug use: No     Allergies   Patient has no known allergies.   Review of Systems Review of Systems  Per HPI Physical Exam Triage Vital Signs ED Triage Vitals  Enc Vitals Group     BP 04/07/21 1340 (!) 168/110     Pulse Rate 04/07/21 1340 68     Resp 04/07/21 1340 18     Temp 04/07/21 1340 (!) 97.4 F (36.3 C)     Temp Source 04/07/21 1340 Oral     SpO2 04/07/21 1340 96 %     Weight 04/07/21 1341 (!) 368 lb (166.9 kg)     Height 04/07/21 1341 6\' 6"  (1.981 m)     Head Circumference --      Peak Flow --      Pain Score 04/07/21 1341 7     Pain Loc --      Pain Edu? --      Excl. in GC? --    No data  found.  Updated Vital Signs BP (!) 168/93 (BP Location: Left Arm)   Pulse 68   Temp (!) 97.4 F (36.3 C) (Oral)   Resp 18   Ht 6\' 6"  (1.981 m)   Wt (!) 368 lb (166.9 kg)   SpO2 96%   BMI 42.53 kg/m   Visual Acuity Right Eye Distance:   Left Eye Distance:   Bilateral Distance:    Right Eye Near:   Left Eye Near:    Bilateral Near:     Physical Exam Constitutional:      Appearance: Normal appearance.  HENT:     Head: Normocephalic and atraumatic.  Eyes:     Extraocular Movements: Extraocular movements intact.     Conjunctiva/sclera: Conjunctivae normal.  Pulmonary:     Effort: Pulmonary effort is normal.  Musculoskeletal:     Right wrist: Swelling and tenderness present. No deformity, bony tenderness, snuff box tenderness or crepitus. Normal range of motion. Normal pulse.     Left wrist: Normal.     Comments: Tenderness to palpation with associated mild swelling to dorsal surface of right wrist.  Neurovascular intact. Negative Tinel's sign.   Neurological:     General: No focal deficit present.     Mental Status: He is alert and oriented to person, place, and time. Mental status is at baseline.  Psychiatric:        Mood and Affect: Mood normal.        Behavior: Behavior normal.        Thought Content: Thought content normal.        Judgment: Judgment normal.     UC Treatments / Results  Labs (all labs ordered are listed, but only abnormal results are displayed) Labs Reviewed - No data to display  EKG   Radiology DG Wrist Complete Right  Result Date: 04/07/2021 CLINICAL DATA:  Right wrist pain EXAM: RIGHT WRIST - COMPLETE 3+ VIEW COMPARISON:  None. FINDINGS: No acute bony abnormality. Specifically, no fracture, subluxation, or dislocation. Joint spaces maintained. No bony erosions. IMPRESSION: No acute bony abnormality. Electronically Signed   By: M.D.   On: 04/07/2021 14:37    Procedures Procedures (including critical care time)  Medications  Ordered in UC Medications - No data to display  Initial Impression / Assessment and Plan / UC Course  I have reviewed the triage vital signs and the nursing notes.  Pertinent labs & imaging results that were available during my  care of the patient were reviewed by me and considered in my medical decision making (see chart for details).     Right wrist x-ray was negative for any acute bony abnormality.  Patient's physical exam could be consistent with gout flareup or carpal tunnel syndrome given patient's symptoms and physical exam.  More suspicious of gout flareup given patient's associated swelling to right wrist.  Will treat with prednisone steroid x5 days as this will decrease inflammation associated with both etiologies.  Patient may continue wrist splint as well.  Patient was provided with contact information for orthopedic specialist if pain persists with current treatment plan.  No red flags seen on exam.  Patient has elevated blood pressure reading as well.  No signs of hypertensive urgency at this time.  Patient to follow-up with PCP for further evaluation and management of hypertension.Discussed strict return precautions. Patient verbalized understanding and is agreeable with plan.  Final Clinical Impressions(s) / UC Diagnoses   Final diagnoses:  Right wrist pain     Discharge Instructions      You are being treated with prednisone steroid to help decrease inflammation in your wrist that should help with pain.  Please follow-up with provided contact information for orthopedist if pain persists.     ED Prescriptions     Medication Sig Dispense Auth. Provider   predniSONE (DELTASONE) 20 MG tablet Take 2 tablets (40 mg total) by mouth daily for 5 days. 10 tablet Lance Muss, FNP      PDMP not reviewed this encounter.   Lance Muss, FNP 04/07/21 1451    Lance Muss, FNP 04/07/21 1452

## 2021-04-07 NOTE — ED Triage Notes (Signed)
Patient c/o right wrist pain for several weeks, no apparent injury, history of gout, some numbness in fingers.  Possible carpel tunnel.

## 2021-04-07 NOTE — Discharge Instructions (Addendum)
You are being treated with prednisone steroid to help decrease inflammation in your wrist that should help with pain.  Please follow-up with provided contact information for orthopedist if pain persists.

## 2021-04-28 ENCOUNTER — Other Ambulatory Visit: Payer: Self-pay | Admitting: Adult Health

## 2021-05-23 ENCOUNTER — Other Ambulatory Visit: Payer: Self-pay | Admitting: Internal Medicine

## 2021-05-23 DIAGNOSIS — I10 Essential (primary) hypertension: Secondary | ICD-10-CM

## 2021-05-26 MED ORDER — LISINOPRIL 40 MG PO TABS: 40.0000 mg | ORAL_TABLET | Freq: Every day | ORAL | 2 refills | Status: DC

## 2021-05-26 MED ORDER — AMLODIPINE BESYLATE 10 MG PO TABS: 10.0000 mg | ORAL_TABLET | Freq: Every day | ORAL | 2 refills | Status: DC

## 2021-05-26 NOTE — Progress Notes (Signed)
Cardiology Office Note:    Date:  05/31/2021   ID:  Derrick Cain., DOB 1971/02/13, MRN [id number]  PCP:  Pcp, No  Cardiologist:  Elouise Munroe, MD  Electrophysiologist:  None   Referring MD: [facility] *   Chief Complaint/Reason for Referral: HTN  History of Present Illness:    Jabron Weese. is a 50 y.o. male with a history of hypertension, chronic renal insufficiency, gout, obesity. The patient and I last met in 12/15/20 for continued management of hypertension.  At home, his blood pressure measurements are normally low 563S systolic. He reports being affected by stress on the job. 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 His job is still giving him stress especially with new drivers and the upcoming holiday season. For the past 3 months, he was working 10-12 hr days for 6 day a week. He believes this rise in blood pressure is due to stress from his job. He chooses to stay at this job because he has worked there for 19 years and has good Scientist, product/process development.  He endorses snoring. He sleeps after coming home from work around 6:30AM and waking up at noon and uses a CPAP. He reports the CPAP has been helping.  He tries to exercise and wants to do better. His long night hours prevent him from formally exercising. He watches his sodium intake and has given up salt. His labs are followed by his nephrologist Dr. Justin Mend.  The patient denies chest pain, chest pressure, dyspnea at rest or with exertion, PND, orthopnea, or leg swelling. Denies cough, fever, chills, nausea, or vomiting. Denies syncope or presyncope. Denies dizziness or lightheadedness.    At his last visit, he reported working third shift as a Barrister's clerk and stated that the stresses of his job particularly in light of Covid have made control of his blood pressure quite challenging.  BP was similar at home and elevated. Planned to reschedule follow up with Nephrology which he had to cancel when his  wife had Joffre.   On 02/15/21, he tested positive for COVID.   On 04/07/21, he presented to the ED for right wrist pain that was present for several weeks. He reported that the pain felt similar to past gout flare-ups. He had associated intermittent numbness in the last 3 digits in the R hand and swelling. He had elevated blood pressure at this visit and was treated with prednisone.   Past Medical History:  Diagnosis Date   Hypertension     No past surgical history on file.  Current Medications: Current Meds  Medication Sig   acetaminophen (TYLENOL) 500 MG tablet Take 1 tablet (500 mg total) every 6 (six) hours as needed by mouth.   amLODipine (NORVASC) 10 MG tablet Take 1 tablet (10 mg total) by mouth daily.   atorvastatin (LIPITOR) 20 MG tablet Take 1 tablet (20 mg total) by mouth daily.   carvedilol (COREG) 25 MG tablet Take 1 tablet (25 mg total) by mouth 2 (two) times daily with a meal.   doxazosin (CARDURA) 8 MG tablet Take 2 tablets (16 mg total) by mouth daily.   lisinopril (ZESTRIL) 40 MG tablet Take 1 tablet (40 mg total) by mouth daily.   [DISCONTINUED] hydrALAZINE (APRESOLINE) 25 MG tablet TAKE 1 TABLET BY MOUTH TWICE A DAY     Allergies:   Patient has no known allergies.   Social History   Tobacco Use   Smoking status: Never  Smokeless tobacco: Never  Vaping Use   Vaping Use: Never used  Substance Use Topics   Alcohol use: No   Drug use: No     Family History: The patient's family history includes Diabetes in his father; Hypertension in his father, mother, and sister.  ROS:   Please see the history of present illness.    (+) Stress (+) Snoring All other systems reviewed and are negative.  EKGs/Labs/Other Studies Reviewed:    The following studies were reviewed today: Echo 01/16/19 1. The left ventricle has normal systolic function with an ejection  fraction of 60-65%. The cavity size was normal. There is mild concentric  left ventricular hypertrophy.  Left ventricular diastolic Doppler  parameters are consistent with pseudonormalization. Elevated mean left atrial pressure No evidence of left ventricular regional wall motion abnormalities.   2. The right ventricle has normal systolic function. The cavity was  normal. There is no increase in right ventricular wall thickness. Right  ventricular systolic pressure is normal with an estimated pressure of 29.0  mmHg.   3. Left atrial size was mildly dilated.   4. The aortic valve is tricuspid. Mild thickening of the aortic valve.  Mild calcification of the aortic valve. No stenosis of the aortic valve.   5. The aortic root and ascending aorta are normal in size and structure.   Monitor 12/21/18 Indication: palpitations  IMPRESSION: occasional SVT, rare nonsustained VT, rare VE and SVE, all associated with patient diary and triggered events.   EKG:   05/31/21: Sinus rhyythm, rate 63 bpm; PAC 12/15/20: NSR, incomplete RBBB  Recent Labs: No results found for requested labs within last 8760 hours.  Recent Lipid Panel    Component Value Date/Time   CHOL 162 05/02/2018 1635   TRIG 109 05/02/2018 1635   HDL 32 (L) 05/02/2018 1635   CHOLHDL 5.1 (H) 05/02/2018 1635   CHOLHDL 5.1 (H) 04/11/2016 1017   VLDL 26 04/11/2016 1017   LDLCALC 108 (H) 05/02/2018 1635    Physical Exam:    VS:  BP (!) 162/94 (BP Location: Left Arm, Patient Position: Sitting, Cuff Size: Large)   Pulse 63   Ht _0  (1.981 m)   Wt (!) 367 lb 9.6 oz (166.7 kg)   SpO2 99%   BMI 42.48 kg/m     Wt Readings from Last 5 Encounters:  05/31/21 (!) 367 lb 9.6 oz (166.7 kg)  04/07/21 (!) 368 lb (166.9 kg)  12/15/20 (!) 365 lb 3.2 oz (165.7 kg)  05/18/20 (!) 355 lb (161 kg)  11/06/19 (!) 359 lb 6.4 oz (163 kg)    Constitutional: No acute distress Eyes: sclera non-icteric, normal conjunctiva and lids ENMT: normal dentition, moist mucous membranes Cardiovascular: regular rhythm, normal rate, no murmurs. S1 and S2 normal.  Radial pulses normal bilaterally. No jugular venous distention.  Respiratory: clear to auscultation bilaterally GI : normal bowel sounds, soft and nontender. No distention.   MSK: extremities warm, well perfused. No edema.  NEURO: grossly nonfocal exam, moves all extremities. PSYCH: alert and oriented x 3, normal mood and affect.   ASSESSMENT:    1. Essential hypertension   2. Stage 3 chronic kidney disease, unspecified whether stage 3a or 3b CKD ([place])   3. OSA on CPAP   4. Pure hypercholesterolemia   5. Medication management   6. Obesity, Class III, BMI 40-49.9 (morbid obesity) (HCC)     PLAN:    Essential hypertension - Plan: EKG 12-Lead - will increase dose of hydralazine  today. We discussed that his BP is persistently elevated at each office visit, and whether this is due to stress or actually represents pathology, it will likely need more aggressive treatment given repeated elevated BP in office.  AntiHTN regimen includes: ACE-I: lisinopril 40 mg daily CCB: amlodipine 10 mg daily BB: carvedilol 25 mg BID A-1 blockade: doxazosin 16 mg daily. Vasodilator: hydralazine 50 mg BID  - I wonder if he may do well on chlorthalidone or spironolactone. Defer to Dr. Crisoforo Oxford and will forward for his review.  Chlorthalidone may be better than hydralazine.  -discussed role of exercise in helping with weight loss and therefore HTN management.   Pure hypercholesterolemia - on atorvastatin 20 mg daily. Needs lipids at next follow up.   OSA - continue CPAP    Total time of encounter: 30 minutes total time of encounter, including 20 minutes spent in face-to-face patient care on the date of this encounter. This time includes coordination of care and counseling regarding above mentioned problem list. Remainder of non-face-to-face time involved reviewing chart documents/testing relevant to the patient encounter and documentation in the medical record. I have independently reviewed  documentation from referring provider.   Cherlynn Kaiser, MD, [facility]   Medication Adjustments/Labs and Tests Ordered: Current medicines are reviewed at length with the patient today.  Concerns regarding medicines are outlined above.   No orders of the defined types were placed in this encounter.   Meds ordered this encounter  Medications   hydrALAZINE (APRESOLINE) 50 MG tablet    Sig: Take 1 tablet (50 mg total) by mouth 2 (two) times daily.    Dispense:  180 tablet    Refill:  3     Patient Instructions  Medication Instructions:  INCREASE HYDRALAZINE TO 13m TWICE DAILY  *If you need a refill on your cardiac medications before your next appointment, please call your pharmacy*  Follow-Up: At [facility] you and your health needs are our priority.  As part of our continuing mission to provide you with exceptional heart care, we have created designated Provider Care Teams.  These Care Teams include your primary Cardiologist (physician) and Advanced Practice Providers (APPs -  Physician Assistants and Nurse Practitioners) who all work together to provide you with the care you need, when you need it.  Your next appointment:   6 month(s)  The format for your next appointment:   In Person  Provider:   GElouise Munroe MD    CALL NEPHROLOGY TO SET UP APPOINTMENT    IWilhemina Bonitoas a scribe for GElouise Munroe MD.,have documented all relevant documentation on the behalf of GElouise Munroe MD,as directed by  GElouise Munroe MD while in the presence of GElouise Munroe MD.  I, GElouise Munroe MD, have reviewed all documentation for this visit. The documentation on 05/31/21 for the exam, diagnosis, procedures, and orders are all accurate and complete.

## 2021-05-31 ENCOUNTER — Ambulatory Visit (INDEPENDENT_AMBULATORY_CARE_PROVIDER_SITE_OTHER): Payer: BC Managed Care – PPO | Admitting: Internal Medicine

## 2021-05-31 ENCOUNTER — Encounter: Payer: Self-pay | Admitting: Internal Medicine

## 2021-05-31 ENCOUNTER — Other Ambulatory Visit: Payer: Self-pay

## 2021-05-31 VITALS — BP 162/94 | HR 63 | Ht 78.0 in | Wt 367.6 lb

## 2021-05-31 DIAGNOSIS — G4733 Obstructive sleep apnea (adult) (pediatric): Secondary | ICD-10-CM

## 2021-05-31 DIAGNOSIS — N183 Chronic kidney disease, stage 3 unspecified: Secondary | ICD-10-CM | POA: Diagnosis not present

## 2021-05-31 DIAGNOSIS — Z9989 Dependence on other enabling machines and devices: Secondary | ICD-10-CM

## 2021-05-31 DIAGNOSIS — E66813 Obesity, class 3: Secondary | ICD-10-CM

## 2021-05-31 DIAGNOSIS — E7800 Pure hypercholesterolemia, unspecified: Secondary | ICD-10-CM

## 2021-05-31 DIAGNOSIS — I10 Essential (primary) hypertension: Secondary | ICD-10-CM

## 2021-05-31 DIAGNOSIS — Z79899 Other long term (current) drug therapy: Secondary | ICD-10-CM

## 2021-05-31 MED ORDER — HYDRALAZINE HCL 50 MG PO TABS: 50.0000 mg | ORAL_TABLET | Freq: Two times a day (BID) | ORAL | 3 refills | Status: DC

## 2021-05-31 NOTE — Patient Instructions (Signed)
Medication Instructions:  INCREASE HYDRALAZINE TO 50mg  TWICE DAILY  *If you need a refill on your cardiac medications before your next appointment, please call your pharmacy*  Follow-Up: At [facility], you and your health needs are our priority.  As part of our continuing mission to provide you with exceptional heart care, we have created designated Provider Care Teams.  These Care Teams include your primary Cardiologist (physician) and Advanced Practice Providers (APPs -  Physician Assistants and Nurse Practitioners) who all work together to provide you with the care you need, when you need it.  Your next appointment:   6 month(s)  The format for your next appointment:   In Person  Provider:   [facility], MD    CALL NEPHROLOGY TO SET UP APPOINTMENT

## 2021-06-01 NOTE — Addendum Note (Signed)
Addended by: Derenda Fennel on: 06/01/2021 05:02 PM   Modules accepted: Orders

## 2021-07-20 ENCOUNTER — Other Ambulatory Visit: Payer: Self-pay

## 2021-07-20 ENCOUNTER — Encounter: Payer: Self-pay | Admitting: Internal Medicine

## 2021-07-20 DIAGNOSIS — I10 Essential (primary) hypertension: Secondary | ICD-10-CM

## 2021-07-20 MED ORDER — DOXAZOSIN MESYLATE 8 MG PO TABS: 16.0000 mg | ORAL_TABLET | Freq: Every day | ORAL | 1 refills | Status: DC

## 2021-07-30 ENCOUNTER — Other Ambulatory Visit: Payer: Self-pay | Admitting: Internal Medicine

## 2021-08-04 ENCOUNTER — Other Ambulatory Visit: Payer: Self-pay

## 2021-08-04 ENCOUNTER — Encounter: Payer: Self-pay | Admitting: Internal Medicine

## 2021-08-04 MED ORDER — ATORVASTATIN CALCIUM 20 MG PO TABS: 20.0000 mg | ORAL_TABLET | Freq: Every day | ORAL | 3 refills | Status: DC

## 2021-08-30 ENCOUNTER — Other Ambulatory Visit: Payer: Self-pay | Admitting: Internal Medicine

## 2021-08-30 MED ORDER — CARVEDILOL 25 MG PO TABS: 25.0000 mg | ORAL_TABLET | Freq: Two times a day (BID) | ORAL | 0 refills | Status: DC

## 2021-08-30 NOTE — Telephone Encounter (Signed)
Refilled medication  and schedule follow up appointment for Nov 29, 2021.  Patient may  reschedule for a different time if  not available.

## 2021-09-21 ENCOUNTER — Telehealth: Payer: BC Managed Care – PPO | Admitting: Physician Assistant

## 2021-09-21 DIAGNOSIS — J069 Acute upper respiratory infection, unspecified: Secondary | ICD-10-CM

## 2021-09-21 DIAGNOSIS — Z20818 Contact with and (suspected) exposure to other bacterial communicable diseases: Secondary | ICD-10-CM

## 2021-09-21 MED ORDER — AMOXICILLIN 500 MG PO TABS: 500.0000 mg | ORAL_TABLET | Freq: Two times a day (BID) | ORAL | 0 refills | Status: AC

## 2021-09-21 NOTE — Patient Instructions (Signed)
?  Ulyses Southward., thank you for joining Leeanne Rio, PA-C for today's virtual visit.  While this provider is not your primary care provider (PCP), if your PCP is located in our provider database this encounter information will be shared with them immediately following your visit. ? ?Consent: ?(Patient) Derrick Cain. provided verbal consent for this virtual visit at the beginning of the encounter. ? ?Current Medications: ? ?Current Outpatient Medications:  ?  acetaminophen (TYLENOL) 500 MG tablet, Take 1 tablet (500 mg total) every 6 (six) hours as needed by mouth., Disp: 30 tablet, Rfl: 0 ?  amLODipine (NORVASC) 10 MG tablet, Take 1 tablet (10 mg total) by mouth daily., Disp: 90 tablet, Rfl: 2 ?  atorvastatin (LIPITOR) 20 MG tablet, Take 1 tablet (20 mg total) by mouth daily., Disp: 90 tablet, Rfl: 3 ?  carvedilol (COREG) 25 MG tablet, Take 1 tablet (25 mg total) by mouth 2 (two) times daily with a meal. Keep appt in May, 2023  for continue refills, Disp: 180 tablet, Rfl: 0 ?  doxazosin (CARDURA) 8 MG tablet, Take 2 tablets (16 mg total) by mouth daily., Disp: 180 tablet, Rfl: 1 ?  hydrALAZINE (APRESOLINE) 50 MG tablet, Take 1 tablet (50 mg total) by mouth 2 (two) times daily., Disp: 180 tablet, Rfl: 3 ?  lisinopril (ZESTRIL) 40 MG tablet, Take 1 tablet (40 mg total) by mouth daily., Disp: 90 tablet, Rfl: 2  ? ?Medications ordered in this encounter:  ?No orders of the defined types were placed in this encounter. ?  ? ?*If you need refills on other medications prior to your next appointment, please contact your pharmacy* ? ?Follow-Up: ?Call back or seek an in-person evaluation if the symptoms worsen or if the condition fails to improve as anticipated. ? ?Other Instructions ?Please keep hydrated and try to get plenty of rest. ?Tylenol for sore throat or headache. ?Start salt-water gargles and place/run a humidifier in the bedroom at night. ?Keep up with your saline nasal rinses and allergy nasal  spray. ?You can use plain Mucinex OTC as well. ? ?Symptoms should calm down in the next 4-5 days. ?If you note a recurrence of the sore throat, any new fever, any pus or patches on the tonsils, giving your potential strep exposure, take the antibiotic as directed that I have placed on file at the pharmacy.  ? ? ?If you have been instructed to have an in-person evaluation today at a local Urgent Care facility, please use the link below. It will take you to a list of all of our available [facility], including address, phone number and hours of operation. Please do not delay care.  ?[facility] ? ?If you or a family member do not have a primary care provider, use the link below to schedule a visit and establish care. When you choose a [facility] primary care physician or advanced practice provider, you gain a long-term partner in health. ?Find a Primary Care Provider ? ?Learn more about 's in-office and virtual care options: ?[facility]  ?

## 2021-09-21 NOTE — Progress Notes (Signed)
?Virtual Visit Consent  ? ?Derrick Cain., you are scheduled for a virtual visit with a [facility] provider today.   ?  ?Just as with appointments in the office, your consent must be obtained to participate.  Your consent will be active for this visit and any virtual visit you may have with one of our providers in the next 365 days.   ?  ?If you have a MyChart account, a copy of this consent can be sent to you electronically.  All virtual visits are billed to your insurance company just like a traditional visit in the office.   ? ?As this is a virtual visit, video technology does not allow for your provider to perform a traditional examination.  This may limit your provider's ability to fully assess your condition.  If your provider identifies any concerns that need to be evaluated in person or the need to arrange testing (such as labs, EKG, etc.), we will make arrangements to do so.   ?  ?Although advances in technology are sophisticated, we cannot ensure that it will always work on either your end or our end.  If the connection with a video visit is poor, the visit may have to be switched to a telephone visit.  With either a video or telephone visit, we are not always able to ensure that we have a secure connection.    ? ?I need to obtain your verbal consent now.   Are you willing to proceed with your visit today?  ?  ?Derrick Aungst. has provided verbal consent on 09/21/2021 for a virtual visit (video or telephone). ?  ?Leeanne Rio, PA-C  ? ?Date: 09/21/2021 5:32 PM ? ? ?Virtual Visit via Video Note  ? ?I, Leeanne Rio, connected with  Derrick Cain.  ([id number], 1971/06/20) on 09/21/21 at  5:30 PM EDT by a video-enabled telemedicine application and verified that I am speaking with the correct person using two identifiers. ? ?Location: ?Patient: Virtual Visit Location Patient: Home ?Provider: Virtual Visit Location Provider: Home Office ?  ?I discussed the limitations of evaluation and  management by telemedicine and the availability of in person appointments. The patient expressed understanding and agreed to proceed.   ? ?History of Present Illness: ?Derrick Cain. is a 51 y.o. who identifies as a male who was assigned male at birth, and is being seen today for possible strep. Notes grandsons with strep throat and he kept them this past weekend. Last night started with significant sore throat, nasal and head congestion. Denies fever, aches but notes chills. Still with some scratchy throat but much improved. Has not noted any white patches in the throat so far.  ? ?HPI: HPI  ?Problems:  ?Patient Active Problem List  ? Diagnosis Date Noted  ? 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 OSA on CPAP 08/28/2018  ? Pain of joint of left ankle and foot 06/13/2018  ? Wrist pain, chronic, left 06/13/2018  ? CKD (chronic kidney disease) stage 3, GFR 30-59 ml/min (HCC) 05/02/2018  ? 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 Severe obstructive sleep apnea 02/21/2018  ? Excessive daytime sleepiness 02/21/2018  ? 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 Nasal obstruction 02/21/2018  ? History of gout 04/11/2016  ? Prediabetes 04/11/2016  ? Nocturia 04/11/2016  ? Left knee pain 04/11/2016  ? Encounter for long-term (current) use of other medications 02/05/2013  ? HTN (hypertension) 01/20/2013  ? Obesity, Class III, BMI  40-49.9 (morbid obesity) ([place]) 01/20/2013  ?  ?Allergies: No Known Allergies ?Medications:  ?Current Outpatient Medications:  ?  acetaminophen (TYLENOL) 500 MG tablet, Take 1 tablet (500 mg total) every 6 (six) hours as needed by mouth., Disp: 30 tablet, Rfl: 0 ?  amLODipine (NORVASC) 10 MG tablet, Take 1 tablet (10 mg total) by mouth daily., Disp: 90 tablet, Rfl: 2 ?  atorvastatin (LIPITOR) 20 MG tablet, Take 1 tablet (20 mg total) by mouth daily., Disp: 90 tablet, Rfl: 3 ?  carvedilol (COREG) 25 MG tablet, Take 1 tablet (25 mg total) by mouth 2  (two) times daily with a meal. Keep appt in May, 2023  for continue refills, Disp: 180 tablet, Rfl: 0 ?  doxazosin (CARDURA) 8 MG tablet, Take 2 tablets (16 mg total) by mouth daily., Disp: 180 tablet, Rfl: 1 ?  hydrALAZINE (APRESOLINE) 50 MG tablet, Take 1 tablet (50 mg total) by mouth 2 (two) times daily., Disp: 180 tablet, Rfl: 3 ?  lisinopril (ZESTRIL) 40 MG tablet, Take 1 tablet (40 mg total) by mouth daily., Disp: 90 tablet, Rfl: 2 ? ?Observations/Objective: ?Patient is well-developed, well-nourished in no acute distress.  ?Resting comfortably at home.  ?Head is normocephalic, atraumatic.  ?No labored breathing. ?Speech is clear and coherent with logical content.  ?Patient is alert and oriented at baseline.  ? ?Assessment and Plan: ?1. Upper respiratory tract infection, unspecified type ? ?Symptoms seem more consistent with viral URI giving nasal congestion and lack of consistent sore throat. Supportive measures and OTC medications reviewed.   ? ?2. Streptococcus exposure ? ?Known close strep exposure. He is afebrile and sore throat has improved. No tonsillar enlargement, tender cervical adenopathy or tonsillar exudate. Other symptoms most consistent with viral URI. Will have him follow instructions above for URI. Will place Amoxicillin on file at pharmacy to take if sore throat recurs, and tonsillar exudate or new onset of fever.  ? ?Follow Up Instructions: ?I discussed the assessment and treatment plan with the patient. The patient was provided an opportunity to ask questions and all were answered. The patient agreed with the plan and demonstrated an understanding of the instructions.  A copy of instructions were sent to the patient via MyChart unless otherwise noted below.  ? ?The patient was advised to call back or seek an in-person evaluation if the symptoms worsen or if the condition fails to improve as anticipated. ? ?Time:  ?I spent 10 minutes with the patient via telehealth technology discussing the  above problems/concerns.   ? ?Leeanne Rio, PA-C ?

## 2021-11-18 ENCOUNTER — Other Ambulatory Visit: Payer: Self-pay | Admitting: Internal Medicine

## 2021-11-29 ENCOUNTER — Ambulatory Visit: Payer: BC Managed Care – PPO | Admitting: Internal Medicine

## 2021-11-29 ENCOUNTER — Encounter: Payer: Self-pay | Admitting: Internal Medicine

## 2021-11-29 VITALS — BP 138/90 | HR 67 | Ht 78.0 in | Wt 395.0 lb

## 2021-11-29 DIAGNOSIS — E7800 Pure hypercholesterolemia, unspecified: Secondary | ICD-10-CM | POA: Diagnosis not present

## 2021-11-29 DIAGNOSIS — N183 Chronic kidney disease, stage 3 unspecified: Secondary | ICD-10-CM | POA: Diagnosis not present

## 2021-11-29 DIAGNOSIS — I10 Essential (primary) hypertension: Secondary | ICD-10-CM | POA: Diagnosis not present

## 2021-11-29 DIAGNOSIS — Z9989 Dependence on other enabling machines and devices: Secondary | ICD-10-CM

## 2021-11-29 DIAGNOSIS — G4733 Obstructive sleep apnea (adult) (pediatric): Secondary | ICD-10-CM

## 2021-11-29 DIAGNOSIS — Z79899 Other long term (current) drug therapy: Secondary | ICD-10-CM

## 2021-11-29 DIAGNOSIS — E66813 Obesity, class 3: Secondary | ICD-10-CM

## 2021-11-29 MED ORDER — CARVEDILOL 25 MG PO TABS: 25.0000 mg | ORAL_TABLET | Freq: Two times a day (BID) | ORAL | 3 refills | Status: DC

## 2021-11-29 NOTE — Patient Instructions (Signed)
Medication Instructions:  CARVEDILOL REFILLED  *If you need a refill on your cardiac medications before your next appointment, please call your pharmacy*  Follow-Up: At [facility], you and your health needs are our priority.  As part of our continuing mission to provide you with exceptional heart care, we have created designated Provider Care Teams.  These Care Teams include your primary Cardiologist (physician) and Advanced Practice Providers (APPs -  Physician Assistants and Nurse Practitioners) who all work together to provide you with the care you need, when you need it.  Your next appointment:   6 month(s)  The format for your next appointment:   In Person  Provider:   Elouise Munroe, MD    Other Instructions Please call if you need screening health maintenance labs.

## 2021-11-29 NOTE — Progress Notes (Addendum)
Cardiology Office Note:    Date:  11/29/2021   ID:  Derrick Southward., DOB [birth year], MRN [id number]  PCP:  Pcp, No  Cardiologist:  Elouise Munroe, MD  Electrophysiologist:  None   Referring MD: No ref. provider found   Chief Complaint/Reason for Referral: HTN  History of Present Illness:    Derrick Moffa. is a 51 y.o. male with a history of hypertension, chronic renal insufficiency, gout, obesity, OSA on CPAP.   Doing well overall with no significant changes.  Does note some weight gain in the recent past which he is working to decrease.  Weight at our last visit was 367 pounds today 395 pounds.  We have previously discussed the incremental benefit of weight loss on blood pressure management.  At his last office visit with Dr. Justin Mend nephrology, they used a larger size blood pressure cuff and his blood pressure readings were near normal.  Dr. Justin Mend did not recommend any medication changes at that time per the patient.  He has noticed increased leg swelling but thinks this may be related to weight gain.  We discussed that this could be related to amlodipine but I am hesitant to make medication changes with his currently stable regimen unless lower extremity swelling is bothersome and persistent.  He is going to try compression socks again.  We will consider chlorthalidone if bothersome swelling persist.  The patient denies chest pain, chest pressure, dyspnea at rest or with exertion, palpitations, PND, orthopnea. Denies cough, fever, chills. Denies nausea, vomiting. Denies syncope or presyncope. Denies dizziness or lightheadedness.    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  I think this will significantly benefit his health.  He is hoping to incorporate more exercise and make small dietary changes.  We discussed dropping sugary sodas which has previously allowed him to lose weight quickly.  In addition I recommended he take a walk  several times a week to begin to increase his physical activity.  He is motivated to do so.  Past Medical History:  Diagnosis Date   Hypertension     No past surgical history on file.  Current Medications: Current Meds  Medication Sig   acetaminophen (TYLENOL) 500 MG tablet Take 1 tablet (500 mg total) every 6 (six) hours as needed by mouth.   amLODipine (NORVASC) 10 MG tablet Take 1 tablet (10 mg total) by mouth daily.   atorvastatin (LIPITOR) 20 MG tablet Take 1 tablet (20 mg total) by mouth daily.   doxazosin (CARDURA) 8 MG tablet Take 2 tablets (16 mg total) by mouth daily.   hydrALAZINE (APRESOLINE) 50 MG tablet Take 1 tablet (50 mg total) by mouth 2 (two) times daily.   lisinopril (ZESTRIL) 40 MG tablet Take 1 tablet (40 mg total) by mouth daily.   [DISCONTINUED] carvedilol (COREG) 25 MG tablet Take 1 tablet (25 mg total) by mouth 2 (two) times daily with a meal. Keep appt in May, 2023  for continue refills     Allergies:   Patient has no known allergies.   Social History   Tobacco Use   Smoking status: Never   Smokeless tobacco: Never  Vaping Use   Vaping Use: Never used  Substance Use Topics   Alcohol use: No   Drug use: No     Family History: The patient's family history includes Diabetes in his father; Hypertension in his father, mother, and sister.  ROS:   Please  see the history of present illness.    (+) Stress (+) Snoring All other systems reviewed and are negative.  EKGs/Labs/Other Studies Reviewed:    The following studies were reviewed today: Echo 01/16/19 1. The left ventricle has normal systolic function with an ejection  fraction of 60-65%. The cavity size was normal. There is mild concentric  left ventricular hypertrophy. Left ventricular diastolic Doppler  parameters are consistent with pseudonormalization. Elevated mean left atrial pressure No evidence of left ventricular regional wall motion abnormalities.   2. The right ventricle has normal  systolic function. The cavity was  normal. There is no increase in right ventricular wall thickness. Right  ventricular systolic pressure is normal with an estimated pressure of 29.0  mmHg.   3. Left atrial size was mildly dilated.   4. The aortic valve is tricuspid. Mild thickening of the aortic valve.  Mild calcification of the aortic valve. No stenosis of the aortic valve.   5. The aortic root and ascending aorta are normal in size and structure.   Monitor 12/21/18 Indication: palpitations  IMPRESSION: occasional SVT, rare nonsustained VT, rare VE and SVE, all associated with patient diary and triggered events.   EKG:  11/29/21: NSR, PAC, rate 67 05/31/21: Sinus rhythm, rate 63 bpm; PAC 12/15/20: NSR, incomplete RBBB  Recent Labs: No results found for requested labs within last 8760 hours.  Recent Lipid Panel    Component Value Date/Time   CHOL 162 05/02/2018 1635   TRIG 109 05/02/2018 1635   HDL 32 (L) 05/02/2018 1635   CHOLHDL 5.1 (H) 05/02/2018 1635   CHOLHDL 5.1 (H) 04/11/2016 1017   VLDL 26 04/11/2016 1017   LDLCALC 108 (H) 05/02/2018 1635    Physical Exam:    VS:  BP 138/90 (BP Location: Left Arm, Patient Position: Sitting, Cuff Size: Large) Comment (Cuff Size): Thigh cuff  Pulse 67   Ht 6\' 6"  (1.981 m)   Wt (!) 395 lb (179.2 kg)   SpO2 97%   BMI 45.65 kg/m     Wt Readings from Last 5 Encounters:  11/29/21 (!) 395 lb (179.2 kg)  05/31/21 (!) 367 lb 9.6 oz (166.7 kg)  04/07/21 (!) 368 lb (166.9 kg)  12/15/20 (!) 365 lb 3.2 oz (165.7 kg)  05/18/20 (!) 355 lb (161 kg)    Constitutional: No acute distress Eyes: sclera non-icteric, normal conjunctiva and lids ENMT: normal dentition, moist mucous membranes Cardiovascular: regular rhythm, normal rate, no murmurs. S1 and S2 normal. No jugular venous distention.  Respiratory: clear to auscultation bilaterally GI : normal bowel sounds, soft and nontender. No distention.   MSK: extremities warm, well perfused. 1+ LE  edema.  NEURO: grossly nonfocal exam, moves all extremities. PSYCH: alert and oriented x 3, normal mood and affect.   ASSESSMENT:    1. Essential hypertension   2. Stage 3 chronic kidney disease, unspecified whether stage 3a or 3b CKD (Derrick Cain)   3. OSA on CPAP   4. Pure hypercholesterolemia   5. Obesity, Class III, BMI 40-49.9 (morbid obesity) (Derrick Cain)   6. Medication management    PLAN:    Essential hypertension - Plan: EKG 12-Lead -Medications are stable today and with the appropriate size thigh cuff, blood pressure reading is much better than previous appointments.  No changes to medication regimen today. AntiHTN regimen includes: ACE-I: lisinopril 40 mg daily CCB: amlodipine 10 mg daily BB: carvedilol 25 mg BID, medication refilled today. A-1 blockade: doxazosin 16 mg daily. Vasodilator: hydralazine 50 mg BID  -  I wonder if he may do well on chlorthalidone in the future as opposed to hydralazine.   -discussed role of exercise in helping with weight loss and therefore HTN management. -He notes some lower extremity swelling which may be attributable to weight gain versus amlodipine.  Recommend compression socks and observation for now.   Pure hypercholesterolemia - on atorvastatin 20 mg daily. Needs lipids at next follow up.  Recommended that he get this with his next blood draw.  He does not have a primary care physician, therefore I am happy to perform screening laboratory studies for him.  He is anticipating a visit with nephrology soon we are happy to provide the orders for labs otherwise these can be ordered by Dr. Justin Mend.  OSA - continue CPAP    Total time of encounter: 30 minutes total time of encounter, including 20 minutes spent in face-to-face patient care on the date of this encounter. This time includes coordination of care and counseling regarding above mentioned problem list. Remainder of non-face-to-face time involved reviewing chart documents/testing relevant to the  patient encounter and documentation in the medical record. I have independently reviewed documentation from referring provider.   Cherlynn Kaiser, MD, [facility]   Medication Adjustments/Labs and Tests Ordered: Current medicines are reviewed at length with the patient today.  Concerns regarding medicines are outlined above.   Orders Placed This Encounter  Procedures   EKG 12-Lead     Meds ordered this encounter  Medications   carvedilol (COREG) 25 MG tablet    Sig: Take 1 tablet (25 mg total) by mouth 2 (two) times daily with a meal.    Dispense:  180 tablet    Refill:  3     Patient Instructions  Medication Instructions:  CARVEDILOL REFILLED  *If you need a refill on your cardiac medications before your next appointment, please call your pharmacy*  Follow-Up: At [facility], you and your health needs are our priority.  As part of our continuing mission to provide you with exceptional heart care, we have created designated Provider Care Teams.  These Care Teams include your primary Cardiologist (physician) and Advanced Practice Providers (APPs -  Physician Assistants and Nurse Practitioners) who all work together to provide you with the care you need, when you need it.  Your next appointment:   6 month(s)  The format for your next appointment:   In Person  Provider:   Elouise Munroe, MD    Other Instructions Please call if you need screening health maintenance labs.

## 2021-12-17 ENCOUNTER — Other Ambulatory Visit: Payer: Self-pay | Admitting: Internal Medicine

## 2022-01-14 ENCOUNTER — Other Ambulatory Visit: Payer: Self-pay | Admitting: Internal Medicine

## 2022-01-14 DIAGNOSIS — I10 Essential (primary) hypertension: Secondary | ICD-10-CM

## 2022-01-17 ENCOUNTER — Encounter: Payer: Self-pay | Admitting: Internal Medicine

## 2022-01-20 ENCOUNTER — Other Ambulatory Visit: Payer: Self-pay

## 2022-01-20 DIAGNOSIS — I10 Essential (primary) hypertension: Secondary | ICD-10-CM

## 2022-01-20 MED ORDER — LISINOPRIL 40 MG PO TABS: 40.0000 mg | ORAL_TABLET | Freq: Every day | ORAL | 2 refills | Status: DC

## 2022-01-20 MED ORDER — DOXAZOSIN MESYLATE 8 MG PO TABS: 16.0000 mg | ORAL_TABLET | Freq: Every day | ORAL | 2 refills | Status: DC

## 2022-01-20 MED ORDER — AMLODIPINE BESYLATE 10 MG PO TABS: 10.0000 mg | ORAL_TABLET | Freq: Every day | ORAL | 2 refills | Status: DC

## 2022-03-17 ENCOUNTER — Other Ambulatory Visit: Payer: Self-pay | Admitting: Internal Medicine

## 2022-05-26 ENCOUNTER — Ambulatory Visit: Payer: BC Managed Care – PPO | Admitting: Internal Medicine

## 2022-06-30 ENCOUNTER — Encounter: Payer: Self-pay | Admitting: Internal Medicine

## 2022-06-30 MED ORDER — HYDRALAZINE HCL 50 MG PO TABS: 50.0000 mg | ORAL_TABLET | Freq: Two times a day (BID) | ORAL | 3 refills | Status: DC

## 2022-07-18 ENCOUNTER — Ambulatory Visit: Payer: BC Managed Care – PPO | Attending: Internal Medicine | Admitting: Internal Medicine

## 2022-07-18 ENCOUNTER — Encounter: Payer: Self-pay | Admitting: Internal Medicine

## 2022-07-18 VITALS — BP 138/86 | HR 60 | Ht 78.0 in | Wt 381.2 lb

## 2022-07-18 DIAGNOSIS — E7800 Pure hypercholesterolemia, unspecified: Secondary | ICD-10-CM | POA: Diagnosis not present

## 2022-07-18 DIAGNOSIS — I10 Essential (primary) hypertension: Secondary | ICD-10-CM | POA: Diagnosis not present

## 2022-07-18 NOTE — Patient Instructions (Signed)
Medication Instructions:  No Changes *If you need a refill on your cardiac medications before your next appointment, please call your pharmacy*   Lab Work: None Ordered If you have labs (blood work) drawn today and your tests are completely normal, you will receive your results only by: Dana (if you have MyChart) OR A paper copy in the mail If you have any lab test that is abnormal or we need to change your treatment, we will call you to review the results.   Testing/Procedures: None   Follow-Up: At [facility], you and your health needs are our priority.  As part of our continuing mission to provide you with exceptional heart care, we have created designated Provider Care Teams.  These Care Teams include your primary Cardiologist (physician) and Advanced Practice Providers (APPs -  Physician Assistants and Nurse Practitioners) who all work together to provide you with the care you need, when you need it.  We recommend signing up for the patient portal called "MyChart".  Sign up information is provided on this After Visit Summary.  MyChart is used to connect with patients for Virtual Visits (Telemedicine).  Patients are able to view lab/test results, encounter notes, upcoming appointments, etc.  Non-urgent messages can be sent to your provider as well.   To learn more about what you can do with MyChart, go to [email or web address].    Your next appointment:   6 month(s)  The format for your next appointment:   In Person  Provider:   Elouise Munroe, MD     Other Instructions None  Important Information About Sugar

## 2022-07-18 NOTE — Progress Notes (Addendum)
Cardiology Office Note:    Date:  07/18/2022   ID:  Derrick Cain., DOB Jun 27, 1971, MRN [id number]  PCP:  Pcp, No  Cardiologist:  Parke Poisson, MD  Electrophysiologist:  None   Referring MD: No ref. provider found   Chief Complaint/Reason for Referral: HTN  History of Present Illness:    Derrick Cain. is a 52 y.o. male with a history of hypertension, chronic renal insufficiency, gout, obesity, OSA on CPAP.   Today, he presents feeling overall well. He has been checking his blood pressure and recently got a reading of 121/81. After rechecking it, it was in the low 120s systolic. He has been compliantly taking his spironolactone and reports that his 25 mg dose daily has it has been significantly helping. This was added by Dr. Signe Colt his nephrologist in the fall of 2023 for HTN and proteinuria with CKD.  His lower extremity edema has decreased and noted that it only swells when he stands for long periods of time. He has been occasionally using compression socks. His weight has dropped from 395 to 381 and he is actively working to drop weight.   He has been on break from work since the 07/02/2022 and has enjoyed his break. He is going to switch to a morning/day shift soon in April. He plans to work a 2 am to 11 am shift which he prefers. [employer].   He denies any palpitations, chest pain, shortness of breath. No lightheadedness, headaches, syncope, orthopnea, or PND.   Past Medical History:  Diagnosis Date   Hypertension     History reviewed. No pertinent surgical history.  Current Medications: Current Meds  Medication Sig   acetaminophen (TYLENOL) 500 MG tablet Take 1 tablet (500 mg total) every 6 (six) hours as needed by mouth.   amLODipine (NORVASC) 10 MG tablet Take 1 tablet (10 mg total) by mouth daily.   atorvastatin (LIPITOR) 20 MG tablet Take 1 tablet (20 mg total) by mouth daily.   carvedilol (COREG) 25 MG tablet Take 1 tablet (25 mg total) by mouth 2  (two) times daily with a meal.   doxazosin (CARDURA) 8 MG tablet Take 2 tablets (16 mg total) by mouth daily.   hydrALAZINE (APRESOLINE) 50 MG tablet Take 1 tablet (50 mg total) by mouth 2 (two) times daily.   lisinopril (ZESTRIL) 40 MG tablet Take 1 tablet (40 mg total) by mouth daily.   spironolactone (ALDACTONE) 25 MG tablet Take 25 mg by mouth daily.     Allergies:   Patient has no known allergies.   Social History   Tobacco Use   Smoking status: Never   Smokeless tobacco: Never  Vaping Use   Vaping Use: Never used  Substance Use Topics   Alcohol use: No   Drug use: No     Family History: The patient's family history includes Diabetes in his father; Hypertension in his father, mother, and sister.  ROS:   Please see the history of present illness.    (+) Mild peripheral edema All other systems reviewed and are negative.  EKGs/Labs/Other Studies Reviewed:    The following studies were reviewed today:  Echo 01/16/19 1. The left ventricle has normal systolic function with an ejection  fraction of 60-65%. The cavity size was normal. There is mild concentric  left ventricular hypertrophy. Left ventricular diastolic Doppler  parameters are consistent with pseudonormalization. Elevated mean left atrial pressure No evidence of left ventricular regional wall motion abnormalities.   2.  The right ventricle has normal systolic function. The cavity was  normal. There is no increase in right ventricular wall thickness. Right  ventricular systolic pressure is normal with an estimated pressure of 29.0  mmHg.   3. Left atrial size was mildly dilated.   4. The aortic valve is tricuspid. Mild thickening of the aortic valve.  Mild calcification of the aortic valve. No stenosis of the aortic valve.   5. The aortic root and ascending aorta are normal in size and structure.   Monitor 12/21/18 Indication: palpitations  IMPRESSION: occasional SVT, rare nonsustained VT, rare VE and SVE, all  associated with patient diary and triggered events.   EKG:  EKG is personally reviewed. 07/18/2022: Sinus rhythm. Rate 60  bpm.  11/29/21: NSR, PAC, rate 67 05/31/21: Sinus rhythm, rate 63 bpm; PAC 12/15/20: NSR, incomplete RBBB  Recent Labs: No results found for requested labs within last 365 days.  Recent Lipid Panel    Component Value Date/Time   CHOL 162 05/02/2018 1635   TRIG 109 05/02/2018 1635   HDL 32 (L) 05/02/2018 1635   CHOLHDL 5.1 (H) 05/02/2018 1635   CHOLHDL 5.1 (H) 04/11/2016 1017   VLDL 26 04/11/2016 1017   LDLCALC 108 (H) 05/02/2018 1635    Physical Exam:    VS:  BP 138/86   Pulse 60   Ht 6\' 6"  (1.981 m)   Wt (!) 381 lb 3.2 oz (172.9 kg)   SpO2 98%   BMI 44.05 kg/m     Wt Readings from Last 5 Encounters:  07/18/22 (!) 381 lb 3.2 oz (172.9 kg)  11/29/21 (!) 395 lb (179.2 kg)  05/31/21 (!) 367 lb 9.6 oz (166.7 kg)  04/07/21 (!) 368 lb (166.9 kg)  12/15/20 (!) 365 lb 3.2 oz (165.7 kg)    Constitutional: No acute distress Eyes: sclera non-icteric, normal conjunctiva and lids ENMT: normal dentition, moist mucous membranes Cardiovascular: regular rhythm, normal rate, no murmurs. S1 and S2 normal. No jugular venous distention.  Respiratory: clear to auscultation bilaterally GI : normal bowel sounds, soft and nontender. No distention.   MSK: extremities warm, well perfused. No LE edema.  NEURO: grossly nonfocal exam, moves all extremities. PSYCH: alert and oriented x 3, normal mood and affect.   ASSESSMENT:    1. Essential hypertension     PLAN:    Essential hypertension - Plan: EKG 12-Lead -Medications are stable today and with the appropriate size thigh cuff, blood pressure reading is well controlled. No changes to medication regimen today. AntiHTN regimen includes: ACE-I: lisinopril 40 mg daily CCB: amlodipine 10 mg daily BB: carvedilol 25 mg BID A-1 blockade: doxazosin 16 mg daily. Vasodilator: hydralazine 50 mg BID MRA: spironolactone 25 mg  daily.    Pure hypercholesterolemia - on atorvastatin 20 mg daily. Needs lipids at next follow up.  Recommended that he get this with his next blood draw.  He does not have a primary care physician, therefore I am happy to perform screening laboratory studies for him. Will order.  OSA - continue CPAP  Plan: -Follow up in 6 months  Cherlynn Kaiser, MD, [facility]   Medication Adjustments/Labs and Tests Ordered: Current medicines are reviewed at length with the patient today.  Concerns regarding medicines are outlined above.   Orders Placed This Encounter  Procedures   EKG 12-Lead   No orders of the defined types were placed in this encounter.  Patient Instructions  Medication Instructions:  No Changes *If you need  a refill on your cardiac medications before your next appointment, please call your pharmacy*   Lab Work: None Ordered If you have labs (blood work) drawn today and your tests are completely normal, you will receive your results only by: MyChart Message (if you have MyChart) OR A paper copy in the mail If you have any lab test that is abnormal or we need to change your treatment, we will call you to review the results.   Testing/Procedures: None   Follow-Up: At [facility], you and your health needs are our priority.  As part of our continuing mission to provide you with exceptional heart care, we have created designated Provider Care Teams.  These Care Teams include your primary Cardiologist (physician) and Advanced Practice Providers (APPs -  Physician Assistants and Nurse Practitioners) who all work together to provide you with the care you need, when you need it.  We recommend signing up for the patient portal called "MyChart".  Sign up information is provided on this After Visit Summary.  MyChart is used to connect with patients for Virtual Visits (Telemedicine).  Patients are able to view lab/test results, encounter notes,  upcoming appointments, etc.  Non-urgent messages can be sent to your provider as well.   To learn more about what you can do with MyChart, go to [email or web address].    Your next appointment:   6 month(s)  The format for your next appointment:   In Person  Provider:   Parke Poisson, MD     Other Instructions None    I,Rachel Rivera,acting as a scribe for Parke Poisson, MD.,have documented all relevant documentation on the behalf of Parke Poisson, MD,as directed by  Parke Poisson, MD while in the presence of Parke Poisson, MD.  I, Parke Poisson, MD, have reviewed all documentation for this visit. The documentation on 07/18/22 for the exam, diagnosis, procedures, and orders are all accurate and complete.

## 2022-07-18 NOTE — Addendum Note (Signed)
Addended by: Rexanne Mano B on: 07/18/2022 11:39 AM   Modules accepted: Orders

## 2022-07-31 ENCOUNTER — Other Ambulatory Visit: Payer: Self-pay | Admitting: Internal Medicine

## 2022-08-04 ENCOUNTER — Encounter: Payer: Self-pay | Admitting: Internal Medicine

## 2022-08-04 MED ORDER — ATORVASTATIN CALCIUM 20 MG PO TABS: 20.0000 mg | ORAL_TABLET | Freq: Every day | ORAL | 3 refills | Status: DC

## 2022-09-19 ENCOUNTER — Other Ambulatory Visit: Payer: Self-pay | Admitting: Internal Medicine

## 2022-09-21 ENCOUNTER — Encounter: Payer: Self-pay | Admitting: Internal Medicine

## 2022-09-21 MED ORDER — CARVEDILOL 25 MG PO TABS: 25.0000 mg | ORAL_TABLET | Freq: Two times a day (BID) | ORAL | 1 refills | Status: DC

## 2022-10-16 ENCOUNTER — Other Ambulatory Visit: Payer: Self-pay | Admitting: Internal Medicine

## 2022-10-16 DIAGNOSIS — I10 Essential (primary) hypertension: Secondary | ICD-10-CM

## 2022-10-18 ENCOUNTER — Encounter: Payer: Self-pay | Admitting: Internal Medicine

## 2022-10-18 DIAGNOSIS — I10 Essential (primary) hypertension: Secondary | ICD-10-CM

## 2022-10-19 MED ORDER — DOXAZOSIN MESYLATE 8 MG PO TABS
16.0000 mg | ORAL_TABLET | Freq: Every day | ORAL | 1 refills | Status: AC
Start: 2022-10-19 — End: ?

## 2022-10-30 IMAGING — DX DG WRIST COMPLETE 3+V*R*
4 series · 4 of 4 positions shown · non-contrast
Comparison: None.

CLINICAL DATA: Right wrist pain

EXAM:
RIGHT WRIST - COMPLETE 3+ VIEW

[wrist pa (1 of 3)]
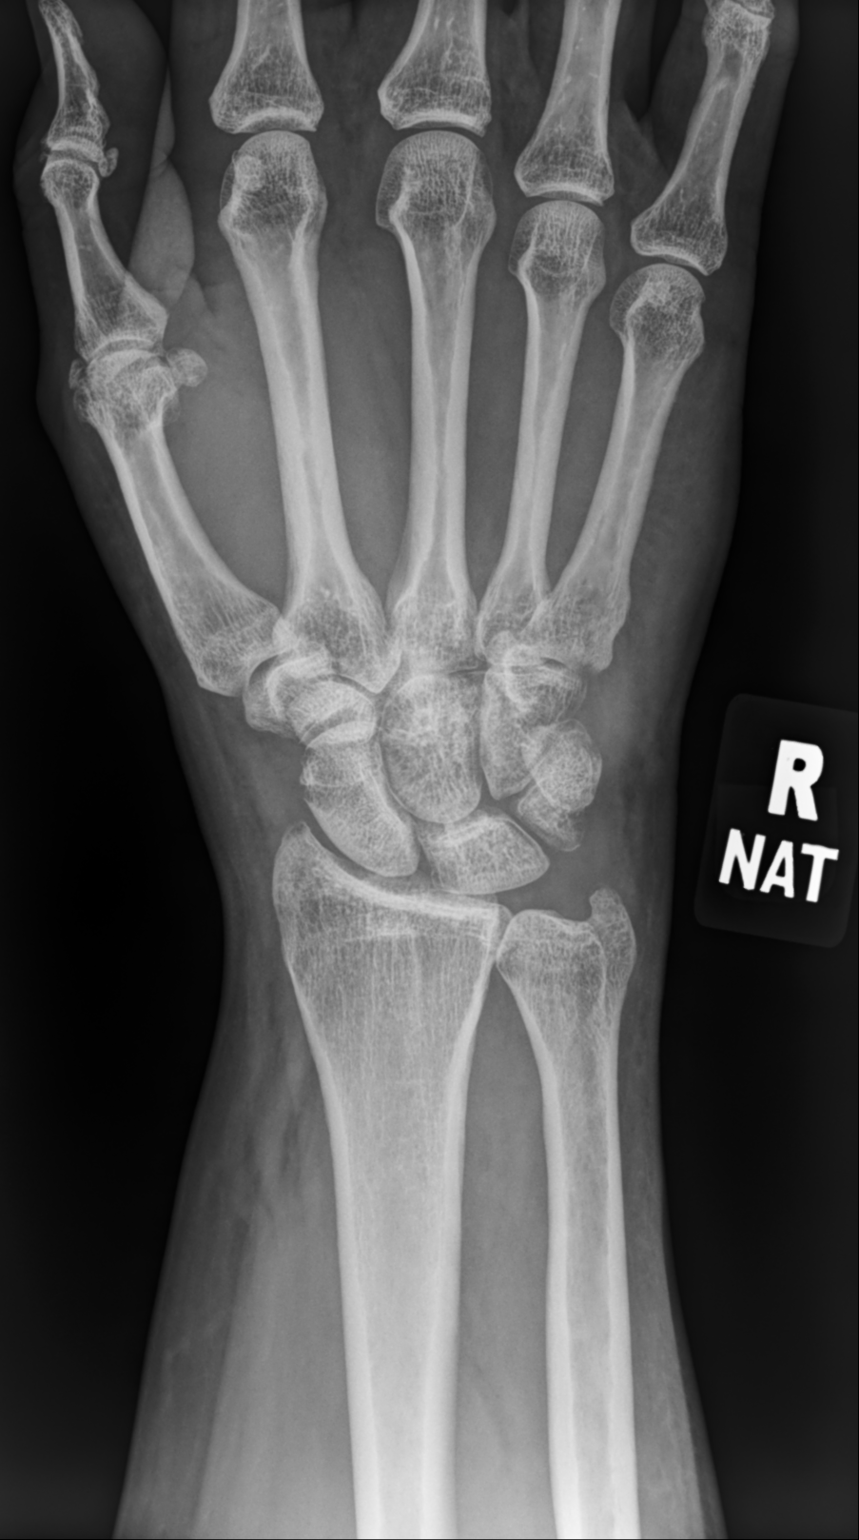

[wrist pa (2 of 3)]
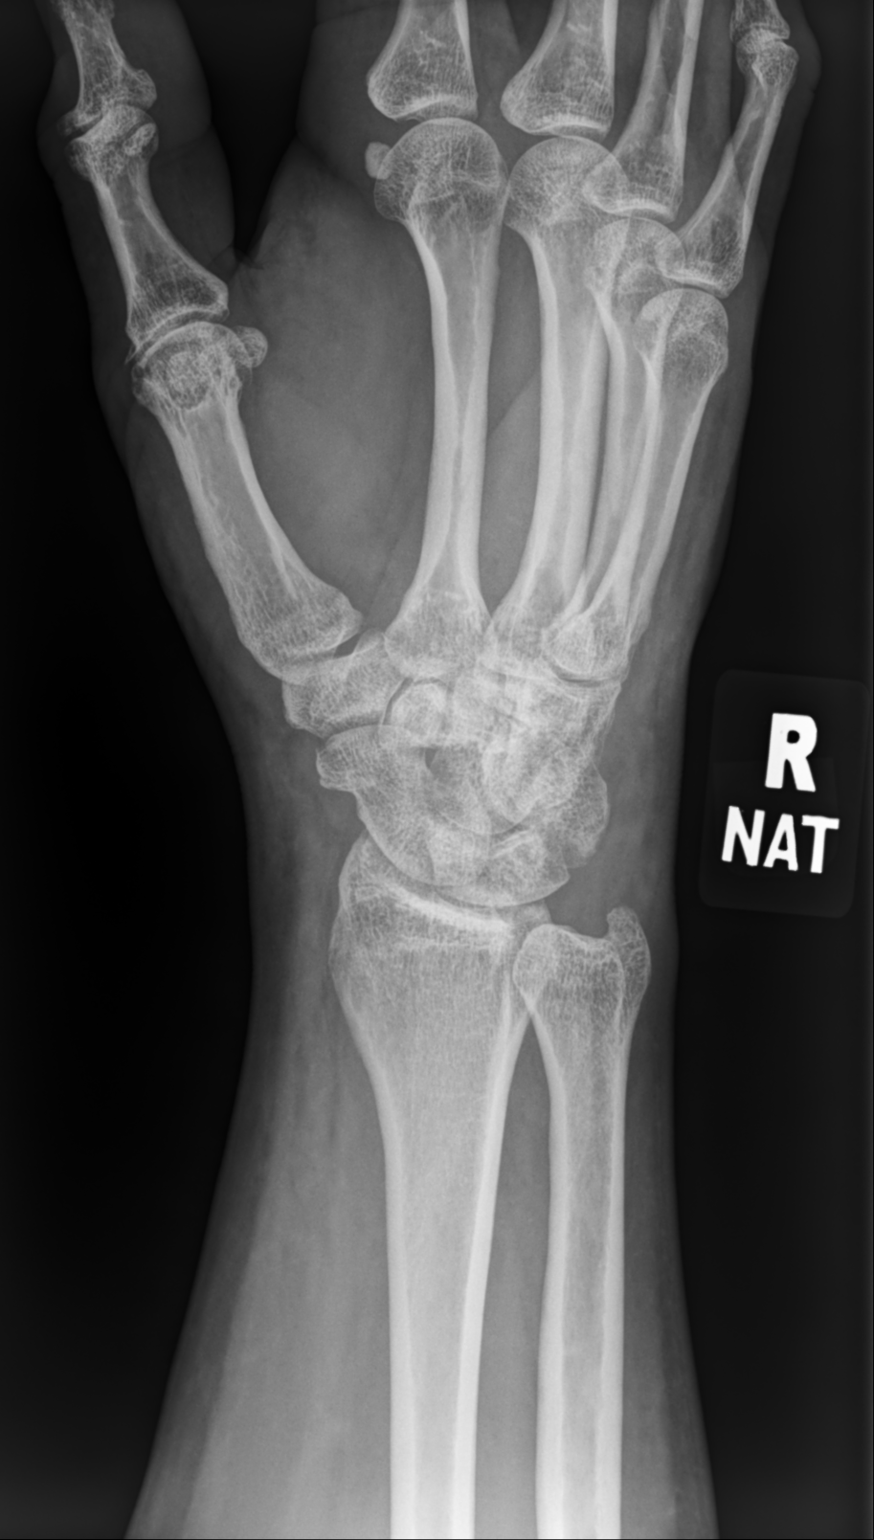

[wrist lat]
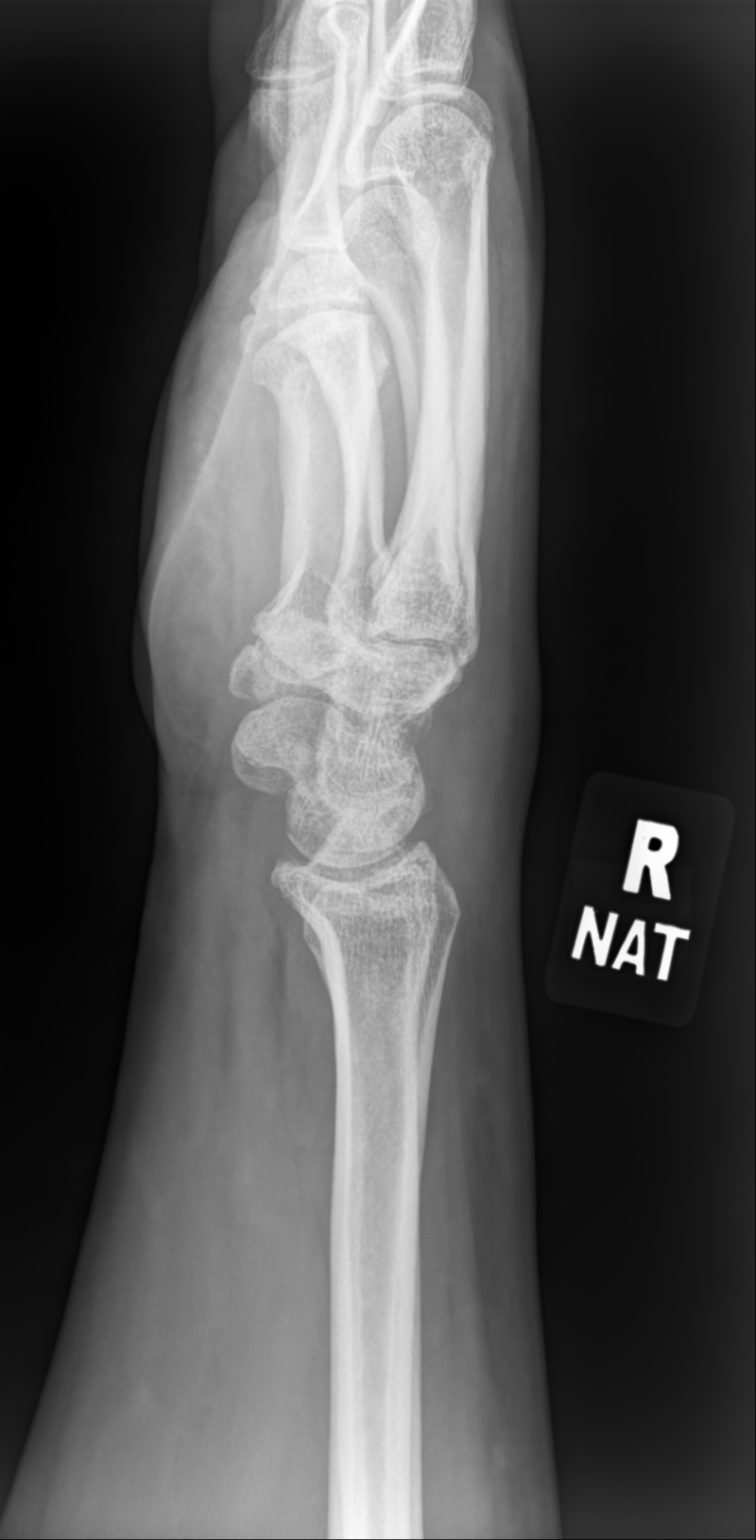

[wrist pa (3 of 3)]
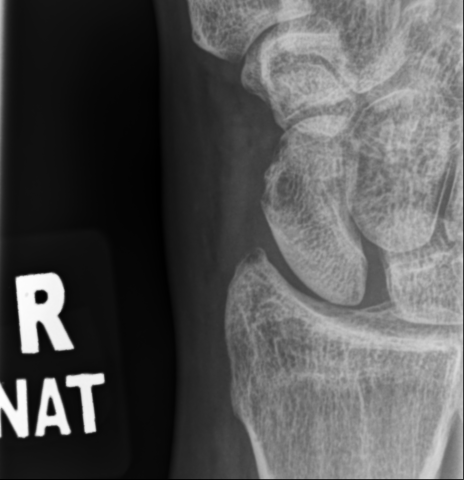

[4 of 4 positions shown; findings below may reference images not displayed]

FINDINGS: No acute bony abnormality. Specifically, no fracture, subluxation,
or dislocation. Joint spaces maintained. No bony erosions.
IMPRESSION: No acute bony abnormality.

## 2022-11-14 ENCOUNTER — Other Ambulatory Visit: Payer: Self-pay | Admitting: Internal Medicine

## 2022-11-16 ENCOUNTER — Encounter: Payer: Self-pay | Admitting: Internal Medicine

## 2022-11-18 ENCOUNTER — Encounter: Payer: Self-pay | Admitting: Internal Medicine

## 2022-11-21 ENCOUNTER — Other Ambulatory Visit: Payer: Self-pay | Admitting: *Deleted

## 2022-11-21 ENCOUNTER — Other Ambulatory Visit: Payer: Self-pay

## 2022-11-21 MED ORDER — LISINOPRIL 40 MG PO TABS: 40.0000 mg | ORAL_TABLET | Freq: Every day | ORAL | 0 refills | Status: DC

## 2022-11-21 MED ORDER — AMLODIPINE BESYLATE 10 MG PO TABS: 10.0000 mg | ORAL_TABLET | Freq: Every day | ORAL | 0 refills | Status: DC

## 2023-01-09 ENCOUNTER — Other Ambulatory Visit: Payer: Self-pay | Admitting: Internal Medicine

## 2023-01-11 ENCOUNTER — Ambulatory Visit: Payer: BC Managed Care – PPO | Admitting: Internal Medicine

## 2023-01-16 ENCOUNTER — Other Ambulatory Visit: Payer: Self-pay

## 2023-01-16 ENCOUNTER — Encounter: Payer: Self-pay | Admitting: Internal Medicine

## 2023-01-16 MED ORDER — CARVEDILOL 25 MG PO TABS: 25.0000 mg | ORAL_TABLET | Freq: Two times a day (BID) | ORAL | 3 refills | Status: DC

## 2023-02-19 ENCOUNTER — Other Ambulatory Visit: Payer: Self-pay | Admitting: Internal Medicine

## 2023-02-21 MED ORDER — LISINOPRIL 40 MG PO TABS: 40.0000 mg | ORAL_TABLET | Freq: Every day | ORAL | 1 refills | Status: DC

## 2023-02-21 MED ORDER — AMLODIPINE BESYLATE 10 MG PO TABS: 10.0000 mg | ORAL_TABLET | Freq: Every day | ORAL | 1 refills | Status: DC

## 2023-03-06 ENCOUNTER — Encounter: Payer: Self-pay | Admitting: Internal Medicine

## 2023-03-06 ENCOUNTER — Telehealth: Payer: Self-pay

## 2023-03-06 ENCOUNTER — Ambulatory Visit: Payer: BC Managed Care – PPO | Attending: Internal Medicine | Admitting: Internal Medicine

## 2023-03-06 VITALS — BP 148/89 | HR 61 | Ht 78.0 in | Wt 375.8 lb

## 2023-03-06 DIAGNOSIS — R9431 Abnormal electrocardiogram [ECG] [EKG]: Secondary | ICD-10-CM

## 2023-03-06 DIAGNOSIS — G4733 Obstructive sleep apnea (adult) (pediatric): Secondary | ICD-10-CM | POA: Diagnosis not present

## 2023-03-06 DIAGNOSIS — E7800 Pure hypercholesterolemia, unspecified: Secondary | ICD-10-CM

## 2023-03-06 DIAGNOSIS — N183 Chronic kidney disease, stage 3 unspecified: Secondary | ICD-10-CM

## 2023-03-06 DIAGNOSIS — I10 Essential (primary) hypertension: Secondary | ICD-10-CM | POA: Diagnosis not present

## 2023-03-06 NOTE — Patient Instructions (Signed)
Medication Instructions:  Your physician recommends that you continue on your current medications as directed. Please refer to the Current Medication list given to you today.  *If you need a refill on your cardiac medications before your next appointment, please call your pharmacy*   Lab Work: Your physician recommends that you return for lab work in: the next 1-2 weeks for FASTING lipids  If you have labs (blood work) drawn today and your tests are completely normal, you will receive your results only by: MyChart Message (if you have MyChart) OR A paper copy in the mail If you have any lab test that is abnormal or we need to change your treatment, we will call you to review the results.   Testing/Procedures: Your physician has requested that you have an echocardiogram. Echocardiography is a painless test that uses sound waves to create images of your heart. It provides your doctor with information about the size and shape of your heart and how well your heart's chambers and valves are working. This procedure takes approximately one hour. There are no restrictions for this procedure. Please do NOT wear cologne, perfume, aftershave, or lotions (deodorant is allowed). Please arrive 15 minutes prior to your appointment time. This will take place at [address] **To do in 4-6 months**    Follow-Up: At [facility], you and your health needs are our priority.  As part of our continuing mission to provide you with exceptional heart care, we have created designated Provider Care Teams.  These Care Teams include your primary Cardiologist (physician) and Advanced Practice Providers (APPs -  Physician Assistants and Nurse Practitioners) who all work together to provide you with the care you need, when you need it.  We recommend signing up for the patient portal called "MyChart".  Sign up information is provided on this After Visit Summary.  MyChart is used to connect with  patients for Virtual Visits (Telemedicine).  Patients are able to view lab/test results, encounter notes, upcoming appointments, etc.  Non-urgent messages can be sent to your provider as well.   To learn more about what you can do with MyChart, go to [email or web address].    Your next appointment:   6 month(s)  Provider:   Parke Poisson, MD

## 2023-03-06 NOTE — Telephone Encounter (Signed)
Mychart message sent to pt regarding AVS and orders placed at office visit on 03/06/23.

## 2023-03-06 NOTE — Progress Notes (Signed)
Cardiology Office Note:    Date:  03/06/2023   ID:  Derrick Roux., DOB Apr 17, 1971, MRN [id number]  PCP:  Pcp, No  Cardiologist:  Parke Poisson, MD  Electrophysiologist:  None   Referring MD: No ref. provider found   Chief Complaint/Reason for Referral: HTN  History of Present Illness:    Derrick Zabel. is a 52 y.o. male with a history of hypertension, chronic renal insufficiency, gout, obesity, OSA on CPAP.   He has since switched to day shifts and is still making the adjustment from previously working third shift for many years.  Feels well overall.  Blood pressure mildly elevated today 148/89.  He has however lost approximately 20 pounds in the last year, and continues to try to focus on diet and lifestyle and exercise.  He has been busy taking care of his parents recently and has not exercise the way that he wants to.  We discussed diet and exercise recommendations today.  Prior visits:  He has been checking his blood pressure and recently got a reading of 121/81. After rechecking it, it was in the low 120s systolic. He has been compliantly taking his spironolactone and reports that his 25 mg dose daily has it has been significantly helping. This was added by Dr. Signe Colt his nephrologist in the fall of 2023 for HTN and proteinuria with CKD.  His lower extremity edema has decreased and noted that it only swells when he stands for long periods of time. He has been occasionally using compression socks. His weight has dropped from 395 to 381 and he is actively working to drop weight.   He has been on break from work since the 07/02/2022 and has enjoyed his break. He is going to switch to a morning/day shift soon in April. He plans to work a 2 am to 11 am shift which he prefers. [employer].   He denies any palpitations, chest pain, shortness of breath. No lightheadedness, headaches, syncope, orthopnea, or PND.   Past Medical History:  Diagnosis Date   Hypertension      No past surgical history on file.  Current Medications: Current Meds  Medication Sig   acetaminophen (TYLENOL) 500 MG tablet Take 1 tablet (500 mg total) every 6 (six) hours as needed by mouth.   amLODipine (NORVASC) 10 MG tablet Take 1 tablet (10 mg total) by mouth daily.   atorvastatin (LIPITOR) 20 MG tablet Take 1 tablet (20 mg total) by mouth daily.   carvedilol (COREG) 25 MG tablet Take 1 tablet (25 mg total) by mouth 2 (two) times daily with a meal.   doxazosin (CARDURA) 8 MG tablet Take 2 tablets (16 mg total) by mouth daily.   hydrALAZINE (APRESOLINE) 50 MG tablet Take 1 tablet (50 mg total) by mouth 2 (two) times daily.   lisinopril (ZESTRIL) 40 MG tablet Take 1 tablet (40 mg total) by mouth daily.   spironolactone (ALDACTONE) 25 MG tablet Take 25 mg by mouth daily.     Allergies:   Patient has no known allergies.   Social History   Tobacco Use   Smoking status: Never   Smokeless tobacco: Never  Vaping Use   Vaping status: Never Used  Substance Use Topics   Alcohol use: No   Drug use: No     Family History: The patient's family history includes Diabetes in his father; Hypertension in his father, mother, and sister.  ROS:   Please see the history of present illness.  All other systems reviewed and are negative.  EKGs/Labs/Other Studies Reviewed:    The following studies were reviewed today:  Echo 01/16/19 1. The left ventricle has normal systolic function with an ejection  fraction of 60-65%. The cavity size was normal. There is mild concentric  left ventricular hypertrophy. Left ventricular diastolic Doppler  parameters are consistent with pseudonormalization. Elevated mean left atrial pressure No evidence of left ventricular regional wall motion abnormalities.   2. The right ventricle has normal systolic function. The cavity was  normal. There is no increase in right ventricular wall thickness. Right  ventricular systolic pressure is normal with an  estimated pressure of 29.0  mmHg.   3. Left atrial size was mildly dilated.   4. The aortic valve is tricuspid. Mild thickening of the aortic valve.  Mild calcification of the aortic valve. No stenosis of the aortic valve.   5. The aortic root and ascending aorta are normal in size and structure.   Monitor 12/21/18 Indication: palpitations  IMPRESSION: occasional SVT, rare nonsustained VT, rare VE and SVE, all associated with patient diary and triggered events.   EKG: EKG Interpretation Date/Time:  Monday March 06 2023 16:11:44 EDT Ventricular Rate:  61 PR Interval:  152 QRS Duration:  98 QT Interval:  416 QTC Calculation: 418 R Axis:   -4  Text Interpretation: Sinus rhythm with Premature atrial complexes in a pattern of bigeminy RSR' or QR pattern in V1 suggests right ventricular conduction delay Confirmed by Derrick Cain ([id number]) on 03/06/2023 5:00:15 PM   07/18/2022: Sinus rhythm. Rate 60  bpm.  11/29/21: NSR, PAC, rate 67 05/31/21: Sinus rhythm, rate 63 bpm; PAC 12/15/20: NSR, incomplete RBBB  Recent Labs: No results found for requested labs within last 365 days.  Recent Lipid Panel    Component Value Date/Time   CHOL 162 05/02/2018 1635   TRIG 109 05/02/2018 1635   HDL 32 (L) 05/02/2018 1635   CHOLHDL 5.1 (H) 05/02/2018 1635   CHOLHDL 5.1 (H) 04/11/2016 1017   VLDL 26 04/11/2016 1017   LDLCALC 108 (H) 05/02/2018 1635    Physical Exam:    VS:  BP (!) 148/89 (BP Location: Right Arm, Patient Position: Sitting, Cuff Size: Large)   Pulse 61   Ht 6\' 6"  (1.981 m)   Wt (!) 375 lb 12.8 oz (170.5 kg)   SpO2 94%   BMI 43.43 kg/m     Wt Readings from Last 5 Encounters:  03/06/23 (!) 375 lb 12.8 oz (170.5 kg)  07/18/22 (!) 381 lb 3.2 oz (172.9 kg)  11/29/21 (!) 395 lb (179.2 kg)  05/31/21 (!) 367 lb 9.6 oz (166.7 kg)  04/07/21 (!) 368 lb (166.9 kg)    Constitutional: No acute distress Eyes: sclera non-icteric, normal conjunctiva and lids ENMT: normal dentition, moist  mucous membranes Cardiovascular: regular rhythm, normal rate, no murmurs. S1 and S2 normal. No jugular venous distention.  Respiratory: clear to auscultation bilaterally GI : normal bowel sounds, soft and nontender. No distention.   MSK: extremities warm, well perfused. No LE edema.  NEURO: grossly nonfocal exam, moves all extremities. PSYCH: alert and oriented x 3, normal mood and affect.   ASSESSMENT:    1. Essential hypertension   2. Pure hypercholesterolemia   3. OSA on CPAP   4. Stage 3 chronic kidney disease, unspecified whether stage 3a or 3b CKD (HCC)   5. Obesity, Class III, BMI 40-49.9 (morbid obesity) (HCC)     PLAN:    Essential hypertension - Plan:  EKG 12-Lead -Medications are stable today. -Blood pressure mildly elevated, though was previously better controlled at last visit.  This is in spite of weight loss.  We will observe this change for now and he is going to try to increase lifestyle factors including exercise and improving diet. -He has some PACs on EKG, and RSR prime pattern, in light of continued hypertension though much better controlled, we will update his echocardiogram prior to next visit.  No changes to medication regimen today. AntiHTN regimen includes: ACE-I: lisinopril 40 mg daily CCB: amlodipine 10 mg daily BB: carvedilol 25 mg BID A-1 blockade: doxazosin 16 mg daily. Vasodilator: hydralazine 50 mg BID MRA: spironolactone 25 mg daily.    Pure hypercholesterolemia - on atorvastatin 20 mg daily.  I had ordered lipid panel but this was not completed, we will encourage him to get this done next available.  Recommended that he get this with his next blood draw.  He does not have a primary care physician, therefore I am happy to perform screening laboratory studies for him. Will order.  OSA - continue CPAP  Plan: -Follow up in 6 months  Derrick Brass, MD, [facility]  [facility]   Medication Adjustments/Labs and Tests Ordered: Current  medicines are reviewed at length with the patient today.  Concerns regarding medicines are outlined above.   Orders Placed This Encounter  Procedures   EKG 12-Lead   EKG 12-Lead   No orders of the defined types were placed in this encounter.  There are no Patient Instructions on file for this visit.

## 2023-04-09 ENCOUNTER — Other Ambulatory Visit: Payer: Self-pay | Admitting: Internal Medicine

## 2023-04-09 DIAGNOSIS — I10 Essential (primary) hypertension: Secondary | ICD-10-CM

## 2023-04-13 ENCOUNTER — Encounter: Payer: Self-pay | Admitting: Internal Medicine

## 2023-04-13 DIAGNOSIS — I10 Essential (primary) hypertension: Secondary | ICD-10-CM

## 2023-04-13 MED ORDER — DOXAZOSIN MESYLATE 8 MG PO TABS
16.0000 mg | ORAL_TABLET | Freq: Every day | ORAL | 3 refills | Status: DC
Start: 2023-04-13 — End: 2024-05-14

## 2023-04-13 MED ORDER — ATORVASTATIN CALCIUM 20 MG PO TABS: 20.0000 mg | ORAL_TABLET | Freq: Every day | ORAL | 3 refills | Status: DC

## 2023-04-13 MED ORDER — HYDRALAZINE HCL 50 MG PO TABS: 50.0000 mg | ORAL_TABLET | Freq: Two times a day (BID) | ORAL | 3 refills | Status: DC

## 2023-06-29 ENCOUNTER — Ambulatory Visit (HOSPITAL_COMMUNITY): Payer: BC Managed Care – PPO | Attending: Cardiology

## 2023-06-29 DIAGNOSIS — I10 Essential (primary) hypertension: Secondary | ICD-10-CM | POA: Diagnosis present

## 2023-06-29 DIAGNOSIS — E7800 Pure hypercholesterolemia, unspecified: Secondary | ICD-10-CM | POA: Insufficient documentation

## 2023-06-29 DIAGNOSIS — G4733 Obstructive sleep apnea (adult) (pediatric): Secondary | ICD-10-CM | POA: Diagnosis present

## 2023-06-29 DIAGNOSIS — R9431 Abnormal electrocardiogram [ECG] [EKG]: Secondary | ICD-10-CM | POA: Insufficient documentation

## 2023-06-29 DIAGNOSIS — N183 Chronic kidney disease, stage 3 unspecified: Secondary | ICD-10-CM | POA: Diagnosis present

## 2023-06-29 LAB — ECHOCARDIOGRAM COMPLETE
Area-P 1/2: 3.66 cm2
S' Lateral: 3.2 cm

## 2023-06-30 ENCOUNTER — Other Ambulatory Visit (HOSPITAL_COMMUNITY): Payer: Self-pay | Admitting: *Deleted

## 2023-06-30 DIAGNOSIS — I10 Essential (primary) hypertension: Secondary | ICD-10-CM

## 2023-06-30 DIAGNOSIS — G4733 Obstructive sleep apnea (adult) (pediatric): Secondary | ICD-10-CM

## 2023-06-30 DIAGNOSIS — R9431 Abnormal electrocardiogram [ECG] [EKG]: Secondary | ICD-10-CM

## 2023-07-01 ENCOUNTER — Encounter: Payer: Self-pay | Admitting: *Deleted

## 2023-07-07 ENCOUNTER — Encounter: Payer: Self-pay | Admitting: *Deleted

## 2023-08-23 ENCOUNTER — Encounter: Payer: Self-pay | Admitting: Internal Medicine

## 2023-08-23 ENCOUNTER — Other Ambulatory Visit: Payer: Self-pay

## 2023-08-23 MED ORDER — AMLODIPINE BESYLATE 10 MG PO TABS: 10.0000 mg | ORAL_TABLET | Freq: Every day | ORAL | 1 refills | Status: DC

## 2023-08-23 MED ORDER — LISINOPRIL 40 MG PO TABS: 40.0000 mg | ORAL_TABLET | Freq: Every day | ORAL | 1 refills | Status: DC

## 2024-01-10 ENCOUNTER — Other Ambulatory Visit: Payer: Self-pay | Admitting: Internal Medicine

## 2024-01-15 ENCOUNTER — Encounter: Payer: Self-pay | Admitting: Internal Medicine

## 2024-01-15 MED ORDER — CARVEDILOL 25 MG PO TABS: 25.0000 mg | ORAL_TABLET | Freq: Two times a day (BID) | ORAL | 0 refills | Status: DC

## 2024-02-16 ENCOUNTER — Other Ambulatory Visit: Payer: Self-pay | Admitting: Internal Medicine

## 2024-02-17 ENCOUNTER — Other Ambulatory Visit: Payer: Self-pay | Admitting: Internal Medicine

## 2024-03-13 ENCOUNTER — Encounter: Payer: Self-pay | Admitting: Internal Medicine

## 2024-03-14 ENCOUNTER — Ambulatory Visit (INDEPENDENT_AMBULATORY_CARE_PROVIDER_SITE_OTHER): Admitting: Radiology

## 2024-03-14 ENCOUNTER — Ambulatory Visit
Admission: RE | Admit: 2024-03-14 | Discharge: 2024-03-14 | Disposition: A | Discharge status: Home / Self Care | Source: Ambulatory Visit

## 2024-03-14 VITALS — BP 154/88 | HR 62 | Temp 98.0°F | Resp 17

## 2024-03-14 DIAGNOSIS — S63501A Unspecified sprain of right wrist, initial encounter: Secondary | ICD-10-CM | POA: Diagnosis not present

## 2024-03-14 DIAGNOSIS — M25531 Pain in right wrist: Secondary | ICD-10-CM | POA: Diagnosis not present

## 2024-03-14 MED ORDER — AMLODIPINE BESYLATE 10 MG PO TABS: 10.0000 mg | ORAL_TABLET | Freq: Every day | ORAL | 0 refills | Status: DC

## 2024-03-14 MED ORDER — DICLOFENAC SODIUM 50 MG PO TBEC: 50.0000 mg | DELAYED_RELEASE_TABLET | Freq: Two times a day (BID) | ORAL | 1 refills | Status: AC

## 2024-03-14 MED ORDER — LISINOPRIL 40 MG PO TABS
40.0000 mg | ORAL_TABLET | Freq: Every day | ORAL | 0 refills | Status: DC
Start: 2024-03-14 — End: 2024-04-11

## 2024-03-14 NOTE — ED Provider Notes (Signed)
 [facility]  Note:  This document was prepared using Dragon voice recognition software and may include unintentional dictation errors.  MRN: [id number] DOB: 02/26/1971  Subjective:   Derrick Cain. is a 53 y.o. male presenting for right lateral wrist pain x 2 days.  Patient reports he has a history of gout but states that symptoms do not feel like previous gout manifestations.  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  Patient reports that pain is slightly worse with movement.  Patient taking Tylenol  with mild improvement to pain.  No current facility-administered medications for this encounter.  Current Outpatient Medications:    diclofenac  (VOLTAREN ) 50 MG EC tablet, Take 1 tablet (50 mg total) by mouth 2 (two) times daily., Disp: 30 tablet, Rfl: 1   acetaminophen  (TYLENOL ) 500 MG tablet, Take 1 tablet (500 mg total) every 6 (six) hours as needed by mouth., Disp: 30 tablet, Rfl: 0   amLODipine  (NORVASC ) 10 MG tablet, Take 1 tablet (10 mg total) by mouth daily., Disp: 90 tablet, Rfl: 1   atorvastatin  (LIPITOR) 20 MG tablet, Take 1 tablet (20 mg total) by mouth daily., Disp: 90 tablet, Rfl: 3   carvedilol  (COREG ) 25 MG tablet, Take 1 tablet (25 mg total) by mouth 2 (two) times daily with a meal., Disp: 180 tablet, Rfl: 0   doxazosin  (CARDURA ) 8 MG tablet, Take 2 tablets (16 mg total) by mouth daily., Disp: 180 tablet, Rfl: 3   hydrALAZINE  (APRESOLINE ) 50 MG tablet, Take 1 tablet (50 mg total) by mouth 2 (two) times daily., Disp: 180 tablet, Rfl: 3   lisinopril  (ZESTRIL ) 40 MG tablet, Take 1 tablet (40 mg total) by mouth daily., Disp: 90 tablet, Rfl: 1   spironolactone (ALDACTONE) 25 MG tablet, Take 25 mg by mouth daily., Disp: , Rfl:    No Known Allergies  Past Medical History:  Diagnosis Date   Hypertension      History reviewed. No  pertinent surgical history.  Family History  Problem Relation Age of Onset   Hypertension Mother    Diabetes Father    Hypertension Father    Hypertension Sister     Social History   Tobacco Use   Smoking status: Never   Smokeless tobacco: Never  Vaping Use   Vaping status: Never Used  Substance Use Topics   Alcohol use: No   Drug use: No    ROS Refer to HPI for ROS details.  Objective:    Vitals: BP (!) 154/88 (BP Location: Right Arm)   Pulse 62   Temp 98 F (36.7 C) (Oral)   Resp 17   SpO2 95%   Physical Exam Vitals and nursing note reviewed.  Constitutional:      General: He is not in acute distress.    Appearance: Normal appearance. He is well-developed. He is not ill-appearing or toxic-appearing.  HENT:     Head: Normocephalic.  Cardiovascular:     Rate and Rhythm: Normal rate.  Pulmonary:     Effort: Pulmonary effort is normal. No respiratory distress.  Musculoskeletal:     Right wrist: Tenderness and bony tenderness present. No swelling, deformity or snuff box tenderness. Decreased range of motion. Normal pulse.  Skin:    General: Skin is warm and dry.  Neurological:     General: No focal deficit present.  Mental Status: He is alert and oriented to person, place, and time.  Psychiatric:        Mood and Affect: Mood normal.        Behavior: Behavior normal.     Procedures  No results found for this or any previous visit (from the past 24 hours).  Assessment and Plan :     Discharge Instructions       1. Right wrist sprain, initial encounter (Primary) - DG Wrist Complete Right x-ray performed in UC shows no acute fracture or dislocation, mild degenerative changes noted, mild dorsal spurring noted, if symptoms do not improve follow-up with orthopedics for further imaging and management. - Apply Wrist brace in UC for wrist protection and immobilization until follow-up and/or symptom resolution. - diclofenac  (VOLTAREN ) 50 MG EC tablet;  Take 1 tablet (50 mg total) by mouth 2 (two) times daily.  Dispense: 30 tablet; Refill: 1 - AMB referral to orthopedics for further evaluation and management of of right wrist pain. -Continue to monitor symptoms for any change in severity if there is any escalation of current symptoms or development of new symptoms follow-up in ER for further evaluation and management.      Phyliss Hulick B Lulabelle Desta   Borna Wessinger, [place], [place] 03/14/24 1113

## 2024-03-14 NOTE — Discharge Instructions (Addendum)
  1. Right wrist sprain, initial encounter (Primary) - DG Wrist Complete Right x-ray performed in UC shows no acute fracture or dislocation, mild degenerative changes noted, mild dorsal spurring noted, if symptoms do not improve follow-up with orthopedics for further imaging and management. - Apply Wrist brace in UC for wrist protection and immobilization until follow-up and/or symptom resolution. - diclofenac  (VOLTAREN ) 50 MG EC tablet; Take 1 tablet (50 mg total) by mouth 2 (two) times daily.  Dispense: 30 tablet; Refill: 1 - AMB referral to orthopedics for further evaluation and management of of right wrist pain. -Continue to monitor symptoms for any change in severity if there is any escalation of current symptoms or development of new symptoms follow-up in ER for further evaluation and management.

## 2024-03-14 NOTE — ED Triage Notes (Addendum)
 Pt c/o sharp pain and swelling in right outer wrist since Tuesday. Denies any know injury  Pt has had issue with gout in hand but states it doesn't feel like that.  Pain is worse with movement

## 2024-03-18 ENCOUNTER — Other Ambulatory Visit: Payer: Self-pay | Admitting: *Deleted

## 2024-03-25 ENCOUNTER — Telehealth: Admitting: Physician Assistant

## 2024-03-25 DIAGNOSIS — M10041 Idiopathic gout, right hand: Secondary | ICD-10-CM | POA: Diagnosis not present

## 2024-03-25 MED ORDER — COLCHICINE 0.6 MG PO TABS: ORAL_TABLET | ORAL | 0 refills | Status: AC

## 2024-03-25 NOTE — Patient Instructions (Signed)
 Derrick Rory Raddle., thank you for joining Delon CHRISTELLA Dickinson, PA-C for today's virtual visit.  While this provider is not your primary care provider (PCP), if your PCP is located in our provider database this encounter information will be shared with them immediately following your visit.   A [facility] MyChart account gives you access to today's visit and all your visits, tests, and labs performed at [facility]  click here if you don't have a [facility] MyChart account or go to [email or web address]  Consent: (Patient) Derrick Cain. provided verbal consent for this virtual visit at the beginning of the encounter.  Current Medications:  Current Outpatient Medications:    colchicine  0.6 MG tablet, Take 2 tablets (1.2mg ) at onset of gout, then 1 tablet twice daily until pain subsides, no more than 7 consecutive days, Disp: 30 tablet, Rfl: 0   acetaminophen  (TYLENOL ) 500 MG tablet, Take 1 tablet (500 mg total) every 6 (six) hours as needed by mouth., Disp: 30 tablet, Rfl: 0   amLODipine  (NORVASC ) 10 MG tablet, Take 1 tablet (10 mg total) by mouth daily. Please keep your appointment with Dr. Loni on 04/11/24 for future refills, Disp: 30 tablet, Rfl: 0   atorvastatin  (LIPITOR) 20 MG tablet, Take 1 tablet (20 mg total) by mouth daily., Disp: 90 tablet, Rfl: 3   carvedilol  (COREG ) 25 MG tablet, Take 1 tablet (25 mg total) by mouth 2 (two) times daily with a meal., Disp: 180 tablet, Rfl: 0   diclofenac  (VOLTAREN ) 50 MG EC tablet, Take 1 tablet (50 mg total) by mouth 2 (two) times daily., Disp: 30 tablet, Rfl: 1   doxazosin  (CARDURA ) 8 MG tablet, Take 2 tablets (16 mg total) by mouth daily., Disp: 180 tablet, Rfl: 3   hydrALAZINE  (APRESOLINE ) 50 MG tablet, Take 1 tablet (50 mg total) by mouth 2 (two) times daily., Disp: 180 tablet, Rfl: 3   lisinopril  (ZESTRIL ) 40 MG tablet, Take 1 tablet (40 mg total) by mouth daily. Please keep your appointment with Dr. Loni on 04/11/24 for  future refills, Disp: 30 tablet, Rfl: 0   spironolactone (ALDACTONE) 25 MG tablet, Take 25 mg by mouth daily., Disp: , Rfl:    Medications ordered in this encounter:  Meds ordered this encounter  Medications   colchicine  0.6 MG tablet    Sig: Take 2 tablets (1.2mg ) at onset of gout, then 1 tablet twice daily until pain subsides, no more than 7 consecutive days    Dispense:  30 tablet    Refill:  0    Supervising Provider:   BLAISE ALEENE KIDD [phone]     *If you need refills on other medications prior to your next appointment, please contact your pharmacy*  Follow-Up: Call back or seek an in-person evaluation if the symptoms worsen or if the condition fails to improve as anticipated.   Virtual Care [phone]  Other Instructions  Gout  Gout is a condition that causes painful swelling of the joints. Gout is a type of inflammation of the joints (arthritis). This condition is caused by having too much uric acid in the body. Uric acid is a chemical that forms when the body breaks down substances called purines. Purines are important for building body proteins. When the body has too much uric acid, sharp crystals can form and build up inside the joints. This causes pain and swelling. Gout attacks can happen quickly and may be very painful (acute gout). Over time, the attacks can affect more joints and  become more frequent (chronic gout). Gout can also cause uric acid to build up under the skin and inside the kidneys. What are the causes? This condition is caused by too much uric acid in your blood. This can happen because: Your kidneys do not remove enough uric acid from your blood. This is the most common cause. Your body makes too much uric acid. This can happen with some cancers and cancer treatments. It can also occur if your body is breaking down too many red blood cells (hemolytic anemia). You eat too many foods that are high in purines. These foods include organ meats  and some seafood. Alcohol, especially beer, is also high in purines. A gout attack may be triggered by trauma or stress. What increases the risk? The following factors may make you more likely to develop this condition: Having a family history of gout. Being male and middle-aged. Being male and having gone through menopause. Taking certain medicines, including aspirin, cyclosporine, diuretics, levodopa, and niacin. Having an organ transplant. Having certain conditions, such as: Being obese. Lead poisoning. Kidney disease. A skin condition called psoriasis. Other factors include: Losing weight too quickly. Being dehydrated. Frequently drinking alcohol, especially beer. Frequently drinking beverages that are sweetened with a type of sugar called fructose. What are the signs or symptoms? An attack of acute gout happens quickly. It usually occurs in just one joint. The most common place is the big toe. Attacks often start at night. Other joints that may be affected include joints of the feet, ankle, knee, fingers, wrist, or elbow. Symptoms of this condition may include: Severe pain. Warmth. Swelling. Stiffness. Tenderness. The affected joint may be very painful to touch. Shiny, red, or purple skin. Chills and fever. Chronic gout may cause symptoms more frequently. More joints may be involved. You may also have white or yellow lumps (tophi) on your hands or feet or in other areas near your joints. How is this diagnosed? This condition is diagnosed based on your symptoms, your medical history, and a physical exam. You may have tests, such as: Blood tests to measure uric acid levels. Removal of joint fluid with a thin needle (aspiration) to look for uric acid crystals. X-rays to look for joint damage. How is this treated? Treatment for this condition has two phases: treating an acute attack and preventing future attacks. Acute gout treatment may include medicines to reduce pain and  swelling, including: NSAIDs, such as ibuprofen. Steroids. These are strong anti-inflammatory medicines that can be taken by mouth (orally) or injected into a joint. Colchicine . This medicine relieves pain and swelling when it is taken soon after an attack. It can be given by mouth or through an IV. Preventive treatment may include: Daily use of smaller doses of NSAIDs or colchicine . Use of a medicine that reduces uric acid levels in your blood, such as allopurinol. Changes to your diet. You may need to see a dietitian about what to eat and drink to prevent gout. Follow these instructions at home: During a gout attack  If directed, put ice on the affected area. To do this: Put ice in a plastic bag. Place a towel between your skin and the bag. Leave the ice on for 20 minutes, 2-3 times a day. Remove the ice if your skin turns bright red. This is very important. If you cannot feel pain, heat, or cold, you have a greater risk of damage to the area. Raise (elevate) the affected joint above the level of your heart as  often as possible. Rest the joint as much as possible. If the affected joint is in your leg, you may be given crutches to use. Follow instructions from your health care provider about eating or drinking restrictions. Avoiding future gout attacks Follow a low-purine diet as told by your dietitian or health care provider. Avoid foods and drinks that are high in purines, including liver, kidney, anchovies, asparagus, herring, mushrooms, mussels, and beer. Maintain a healthy weight or lose weight if you are overweight. If you want to lose weight, talk with your health care provider. Do not lose weight too quickly. Start or maintain an exercise program as told by your health care provider. Eating and drinking Avoid drinking beverages that contain fructose. Drink enough fluids to keep your urine pale yellow. If you drink alcohol: Limit how much you have to: 0-1 drink a day for women who  are not pregnant. 0-2 drinks a day for men. Know how much alcohol is in a drink. In the [place] one drink equals one 12 oz bottle of beer (355 mL), one 5 oz glass of wine (148 mL), or one 1 oz glass of hard liquor (44 mL). General instructions Take over-the-counter and prescription medicines only as told by your health care provider. Ask your health care provider if the medicine prescribed to you requires you to avoid driving or using machinery. Return to your normal activities as told by your health care provider. Ask your health care provider what activities are safe for you. Keep all follow-up visits. This is important. Where to find more information [employer]: [email or web address] Contact a health care provider if you have: Another gout attack. Continuing symptoms of a gout attack after 10 days of treatment. Side effects from your medicines. Chills or a fever. Burning pain when you urinate. Pain in your lower back or abdomen. Get help right away if you: Have severe or uncontrolled pain. Cannot urinate. Summary Gout is painful swelling of the joints caused by having too much uric acid in the body. The most common site for gout to occur is in the big toe, but it can affect other joints in the body. Medicines and dietary changes can help to prevent and treat gout attacks. This information is not intended to replace advice given to you by your health care provider. Make sure you discuss any questions you have with your health care provider. Document Revised: 03/31/2021 Document Reviewed: 03/31/2021 Elsevier Patient Education  2024 Elsevier Inc.   If you have been instructed to have an in-person evaluation today at a local Urgent Care facility, please use the link below. It will take you to a list of all of our available [facility], including address, phone number and hours of operation. Please do not delay care.  [facility]  If you or a  family member do not have a primary care provider, use the link below to schedule a visit and establish care. When you choose a [facility] primary care physician or advanced practice provider, you gain a long-term partner in health. Find a Primary Care Provider  Learn more about [facility]'s in-office and virtual care options:  - Get Care Now

## 2024-03-25 NOTE — Progress Notes (Signed)
 Virtual Visit Consent   Derrick Cain., you are scheduled for a virtual visit with a [facility] provider today. Just as with appointments in the office, your consent must be obtained to participate. Your consent will be active for this visit and any virtual visit you may have with one of our providers in the next 365 days. If you have a MyChart account, a copy of this consent can be sent to you electronically.  As this is a virtual visit, video technology does not allow for your provider to perform a traditional examination. This may limit your provider's ability to fully assess your condition. If your provider identifies any concerns that need to be evaluated in person or the need to arrange testing (such as labs, EKG, etc.), we will make arrangements to do so. Although advances in technology are sophisticated, we cannot ensure that it will always work on either your end or our end. If the connection with a video visit is poor, the visit may have to be switched to a telephone visit. With either a video or telephone visit, we are not always able to ensure that we have a secure connection.  By engaging in this virtual visit, you consent to the provision of healthcare and authorize for your insurance to be billed (if applicable) for the services provided during this visit. Depending on your insurance coverage, you may receive a charge related to this service.  I need to obtain your verbal consent now. Are you willing to proceed with your visit today? Derrick Cain. has provided verbal consent on 03/25/2024 for a virtual visit (video or telephone). Delon CHRISTELLA Dickinson, PA-C  Date: 03/25/2024 1:00 PM   Virtual Visit via Video Note   I, Delon CHRISTELLA Dickinson, connected with  Derrick Cain.  ([id number], [birth year]) on 03/25/24 at 12:45 PM EDT by a video-enabled telemedicine application and verified that I am speaking with the correct person using two identifiers.  Location: Patient: Virtual Visit  Location Patient: Home Provider: Virtual Visit Location Provider: Home Office   I discussed the limitations of evaluation and management by telemedicine and the availability of in person appointments. The patient expressed understanding and agreed to proceed.    History of Present Illness: Derrick Cain. is a 53 y.o. who identifies as a male who was assigned male at birth, and is being seen today for right hand pain.  HPI: Hand Pain  The incident occurred at home. There was no injury mechanism. The pain is present in the right fingers and right hand (right index finger at the MCP joint). The quality of the pain is described as aching. The pain does not radiate. The pain is mild. The pain has been Constant since the incident. Pertinent negatives include no chest pain, muscle weakness, numbness or tingling. The symptoms are aggravated by movement and palpation. He has tried immobilization for the symptoms. The treatment provided no relief.     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   OSA on CPAP 08/28/2018   Pain of joint of left ankle and foot 06/13/2018   Wrist pain, chronic, left 06/13/2018   CKD (chronic kidney disease) stage 3, GFR 30-59 ml/min (HCC) 05/02/2018   Circadian rhythm sleep disorder, shift work type 02/21/2018   Severe obstructive sleep apnea 02/21/2018   Excessive daytime sleepiness 02/21/2018   Hypoventilation associated with obesity syndrome (HCC) 02/21/2018   Nasal obstruction 02/21/2018   History of  gout 04/11/2016   Prediabetes 04/11/2016   Nocturia 04/11/2016   Left knee pain 04/11/2016   Encounter for long-term (current) use of other medications 02/05/2013   HTN (hypertension) 01/20/2013   Obesity, Class III, BMI 40-49.9 (morbid obesity) (HCC) 01/20/2013    Allergies: No Known Allergies Medications:  Current Outpatient Medications:    colchicine  0.6 MG tablet, Take 2 tablets (1.2mg ) at onset of  gout, then 1 tablet twice daily until pain subsides, no more than 7 consecutive days, Disp: 30 tablet, Rfl: 0   acetaminophen  (TYLENOL ) 500 MG tablet, Take 1 tablet (500 mg total) every 6 (six) hours as needed by mouth., Disp: 30 tablet, Rfl: 0   amLODipine  (NORVASC ) 10 MG tablet, Take 1 tablet (10 mg total) by mouth daily. Please keep your appointment with Dr. Loni on 04/11/24 for future refills, Disp: 30 tablet, Rfl: 0   atorvastatin  (LIPITOR) 20 MG tablet, Take 1 tablet (20 mg total) by mouth daily., Disp: 90 tablet, Rfl: 3   carvedilol  (COREG ) 25 MG tablet, Take 1 tablet (25 mg total) by mouth 2 (two) times daily with a meal., Disp: 180 tablet, Rfl: 0   diclofenac  (VOLTAREN ) 50 MG EC tablet, Take 1 tablet (50 mg total) by mouth 2 (two) times daily., Disp: 30 tablet, Rfl: 1   doxazosin  (CARDURA ) 8 MG tablet, Take 2 tablets (16 mg total) by mouth daily., Disp: 180 tablet, Rfl: 3   hydrALAZINE  (APRESOLINE ) 50 MG tablet, Take 1 tablet (50 mg total) by mouth 2 (two) times daily., Disp: 180 tablet, Rfl: 3   lisinopril  (ZESTRIL ) 40 MG tablet, Take 1 tablet (40 mg total) by mouth daily. Please keep your appointment with Dr. Loni on 04/11/24 for future refills, Disp: 30 tablet, Rfl: 0   spironolactone (ALDACTONE) 25 MG tablet, Take 25 mg by mouth daily., Disp: , Rfl:   Observations/Objective: Patient is well-developed, well-nourished in no acute distress.  Resting comfortably  at home.  Head is normocephalic, atraumatic.  No labored breathing.  Speech is clear and coherent with logical content.  Patient is alert and oriented at baseline.    Assessment and Plan: 1. Acute idiopathic gout of right hand (Primary) - colchicine  0.6 MG tablet; Take 2 tablets (1.2mg ) at onset of gout, then 1 tablet twice daily until pain subsides, no more than 7 consecutive days  Dispense: 30 tablet; Refill: 0  - Worsening in the right 2nd finger (index) at the MCP joint on the right hand - Colchicine  prescribed -  Can continue Diclofenac  - Ice as needed - May continue to use brace as needed, especially at work - Work note provided - Seek in person evaluation if not improving  Follow Up Instructions: I discussed the assessment and treatment plan with the patient. The patient was provided an opportunity to ask questions and all were answered. The patient agreed with the plan and demonstrated an understanding of the instructions.  A copy of instructions were sent to the patient via MyChart unless otherwise noted below.    The patient was advised to call back or seek an in-person evaluation if the symptoms worsen or if the condition fails to improve as anticipated.    Delon CHRISTELLA Dickinson, PA-C

## 2024-04-06 ENCOUNTER — Other Ambulatory Visit: Payer: Self-pay | Admitting: Internal Medicine

## 2024-04-11 ENCOUNTER — Ambulatory Visit: Attending: Internal Medicine | Admitting: Internal Medicine

## 2024-04-11 ENCOUNTER — Other Ambulatory Visit: Payer: Self-pay | Admitting: Internal Medicine

## 2024-04-11 ENCOUNTER — Encounter: Payer: Self-pay | Admitting: Internal Medicine

## 2024-04-11 VITALS — BP 152/92 | HR 68 | Ht 78.0 in | Wt 383.2 lb

## 2024-04-11 DIAGNOSIS — E66813 Obesity, class 3: Secondary | ICD-10-CM

## 2024-04-11 DIAGNOSIS — I10 Essential (primary) hypertension: Secondary | ICD-10-CM

## 2024-04-11 DIAGNOSIS — I4510 Unspecified right bundle-branch block: Secondary | ICD-10-CM

## 2024-04-11 DIAGNOSIS — E7800 Pure hypercholesterolemia, unspecified: Secondary | ICD-10-CM | POA: Diagnosis not present

## 2024-04-11 DIAGNOSIS — R9431 Abnormal electrocardiogram [ECG] [EKG]: Secondary | ICD-10-CM

## 2024-04-11 DIAGNOSIS — Z79899 Other long term (current) drug therapy: Secondary | ICD-10-CM

## 2024-04-11 DIAGNOSIS — G4733 Obstructive sleep apnea (adult) (pediatric): Secondary | ICD-10-CM

## 2024-04-11 DIAGNOSIS — N183 Chronic kidney disease, stage 3 unspecified: Secondary | ICD-10-CM

## 2024-04-11 DIAGNOSIS — R7303 Prediabetes: Secondary | ICD-10-CM

## 2024-04-11 MED ORDER — LISINOPRIL 40 MG PO TABS: 40.0000 mg | ORAL_TABLET | Freq: Every day | ORAL | 3 refills | Status: AC

## 2024-04-11 MED ORDER — AMLODIPINE BESYLATE 10 MG PO TABS: 10.0000 mg | ORAL_TABLET | Freq: Every day | ORAL | 3 refills | Status: AC

## 2024-04-11 NOTE — Progress Notes (Signed)
 Cardiology Office Note:  .   Date:  04/11/2024  ID:  Derrick Rory Raddle., DOB [birth year], MRN [id number] PCP: Freddrick Johns  [facility] Providers Cardiologist:  Soyla DELENA Merck, MD    History of Present Illness: .   Derrick Zietz. is a 53 y.o. male.  Discussed the use of AI scribe software for clinical note transcription with the patient, who gave verbal consent to proceed.  History of Present Illness Derrick Cain. is a 53 year old male with hypertension who presents for cardiovascular follow-up.  Hypertension remains difficult to control despite multiple antihypertensive medications, including amlodipine , carvedilol , doxazosin , hydralazine , lisinopril , and spironolactone. Blood pressure readings being elevated attributed to weight management.  Weight gain is associated with feelings of depression following the loss of several family members including his father around Thanksgiving last year. He has prediabetes and is on atorvastatin  for cholesterol management, with the last cholesterol check in 2019 available for my review.  He does not have a primary care physician yet.    ROS: negative except per HPI above.  Studies Reviewed: SABRA   EKG Interpretation Date/Time:  Thursday April 11 2024 08:43:48 EDT Ventricular Rate:  68 PR Interval:  164 QRS Duration:  124 QT Interval:  398 QTC Calculation: 423 R Axis:   -38  Text Interpretation: Normal sinus rhythm with sinus arrhythmia Left axis deviation Right bundle branch block When compared with ECG of 06-Mar-2023 16:11, Premature atrial complexes are no longer Present Right bundle branch block has replaced RSR' pattern in V1 Confirmed by Merck Soyla ([id number]) on 04/11/2024 9:05:56 AM    Results DIAGNOSTIC EKG: Right bundle branch block (04/11/2024) Echocardiogram: Normal left ventricular function, normal right heart, normal intracardiac pressures (06/2023) Risk Assessment/Calculations:       Physical Exam:   VS:  BP  (!) 152/92   Pulse 68   Ht 6' 6 (1.981 m)   Wt (!) 383 lb 3.2 oz (173.8 kg)   SpO2 99%   BMI 44.28 kg/m    Wt Readings from Last 3 Encounters:  04/11/24 (!) 383 lb 3.2 oz (173.8 kg)  03/06/23 (!) 375 lb 12.8 oz (170.5 kg)  07/18/22 (!) 381 lb 3.2 oz (172.9 kg)     Physical Exam GENERAL: Alert, cooperative, well developed, no acute distress. HEENT: Normocephalic, normal oropharynx, moist mucous membranes. CHEST: Clear to auscultation bilaterally, no wheezes, rhonchi, or crackles. CARDIOVASCULAR: Normal heart rate and rhythm, S1 and S2 normal without murmurs.  ABDOMEN: Soft, non-tender, non-distended, without organomegaly, normal bowel sounds. EXTREMITIES: No cyanosis or edema. NEUROLOGICAL: Cranial nerves grossly intact, moves all extremities without gross motor or sensory deficit.   ASSESSMENT AND PLAN: .    Assessment and Plan Assessment & Plan Hypertension in the setting of obesity and chronic kidney disease Hypertension suboptimally controlled, likely exacerbated by weight gain. Current medication regimen includes amlodipine , carvedilol , doxazosin , hydralazine , lisinopril , and spironolactone.  - Discussed weight loss strategies, including potential GLP-1 agonist use. - Refer to pharmacist clinic for GLP-1 agonist initiation  - Refill lisinopril  and amlodipine  for 90 days at [facility]. No changes to medication regimen today. Continue all as below: AntiHTN regimen includes: ACE-I: lisinopril  40 mg daily CCB: amlodipine  10 mg daily BB: carvedilol  25 mg BID A-1 blockade: doxazosin  16 mg daily. Vasodilator: hydralazine  50 mg BID MRA: spironolactone 25 mg daily.   Obesity Obesity contributing to elevated blood pressure. Discussed GLP-1 agonist medications for weight loss, with potential benefits for blood pressure control. - Refer to pharmacist clinic  for GLP-1 agonist initiation and insurance authorization.  Dyslipidemia Cholesterol levels not recently checked.  Currently on atorvastatin . - Check fasting lipid panel prior to next follow-up visit if not done by new primary care provider. - Continue atorvastatin  20 mg daily.  Right bundle branch block Right bundle branch block on EKG. Previous echocardiogram normal. Continued monitoring warranted. - Schedule echocardiogram for December 2025.  Prediabetes Prediabetes noted. Discussed GLP-1 agonists for weight management and diabetes prevention. - Refer to pharmacist clinic for GLP-1 agonist initiation and insurance authorization.        Soyla Merck, MD, FACC

## 2024-04-11 NOTE — Patient Instructions (Addendum)
 Medication Instructions:   Your physician recommends that you continue on your current medications as directed. Please refer to the Current Medication list given to you today.   *If you need a refill on your cardiac medications before your next appointment, please call your pharmacy*   Lab Work:   PLEASE GO DOWN STAIRS  [facility]  FIRST FLOOR   ( GET OFF ELEVATORS WALK TOWARDS WAITING AREA LAB LOCATED BY PHARMACY): LIPIDS  IN 4-5  MONTHS     If you have labs (blood work) drawn today and your tests are completely normal, you will receive your results only by: MyChart Message (if you have MyChart) OR A paper copy in the mail If you have any lab test that is abnormal or we need to change your treatment, we will call you to review the results.  Testing/Procedures:  SCHEDULE  echocardiogram. Echocardiography is a painless test that uses sound waves to create images of your heart. It provides your doctor with information about the size and shape of your heart and how well your heart's chambers and valves are working. This procedure takes approximately one hour. There are no restrictions for this procedure. Please do NOT wear cologne, perfume, aftershave, or lotions (deodorant is allowed). Please arrive 15 minutes prior to your appointment time.  Please note: We ask at that you not bring children with you during ultrasound (echo/ vascular) testing. Due to room size and safety concerns, children are not allowed in the ultrasound rooms during exams. Our front office staff cannot provide observation of children in our lobby area while testing is being conducted. An adult accompanying a patient to their appointment will only be allowed in the ultrasound room at the discretion of the ultrasound technician under special circumstances. We apologize for any inconvenience.     Follow-Up: At [facility], you and your health needs are our priority.  As part of our continuing mission to provide you  with exceptional heart care, our providers are all part of one team.  This team includes your primary Cardiologist (physician) and Advanced Practice Providers or APPs (Physician Assistants and Nurse Practitioners) who all work together to provide you with the care you need, when you need it.  Your next appointment:  You have been referred to YOU HAVE BEEN REFERRED TO PHARM -D  GLP1   6 month(s)  Provider:   Soyla DELENA Merck, MD    We recommend signing up for the patient portal called MyChart.  Sign up information is provided on this After Visit Summary.  MyChart is used to connect with patients for Virtual Visits (Telemedicine).  Patients are able to view lab/test results, encounter notes, upcoming appointments, etc.  Non-urgent messages can be sent to your provider as well.   To learn more about what you can do with MyChart, go to [email or web address].   Other Instructions

## 2024-04-12 ENCOUNTER — Other Ambulatory Visit: Payer: Self-pay | Admitting: Internal Medicine

## 2024-04-16 ENCOUNTER — Ambulatory Visit: Admitting: Orthopaedic Surgery

## 2024-05-07 ENCOUNTER — Encounter: Payer: Self-pay | Admitting: Physician Assistant

## 2024-05-07 ENCOUNTER — Other Ambulatory Visit: Payer: Self-pay

## 2024-05-07 ENCOUNTER — Ambulatory Visit: Admitting: Physician Assistant

## 2024-05-07 DIAGNOSIS — M542 Cervicalgia: Secondary | ICD-10-CM

## 2024-05-07 NOTE — Progress Notes (Signed)
 Office Visit Note   Patient: Derrick Cain.           Date of Birth: [birth year]           MRN: [id number] Visit Date: 05/07/2024              Requested by: No referring provider defined for this encounter. PCP: Pcp, No   Assessment & Plan: Visit Diagnoses:  1. Neck pain     Plan: Patient with 1+ history of neck pain.  Denies any injuries he does work as a curator at nights.  He also states that he thinks he was sleeping wrong he did get new pillows which seem to help a little bit.  X-rays today demonstrate degenerative changes at C5-6 C6-7 and he has pain that goes down into his right shoulder right arm.  His strength is intact.  I suggest some physical therapy.  Unfortunately he cannot take anti-inflammatories because of his kidney disease.  Told him to take Tylenol  on a regular basis if he does not get better with physical therapy would recommend an MRI with possible injections with Dr. Eldonna.  Follow-Up Instructions: Return if symptoms worsen or fail to improve.   Orders:  Orders Placed This Encounter  Procedures   XR Cervical Spine 2 or 3 views   Ambulatory referral to Physical Therapy   No orders of the defined types were placed in this encounter.     Procedures: No procedures performed   Clinical Data: No additional findings.   Subjective: Chief Complaint  Patient presents with   Neck - Pain    HPI patient is a pleasant 53 year old gentleman who comes in with a chief complaint of neck pain into the shoulders with associated numbness in his right arm.  Been going on for about a year and no known injury.  He also has some trigger points in the right shoulder blade area too  Review of Systems  All other systems reviewed and are negative.    Objective: Vital Signs: There were no vitals taken for this visit.  Physical Exam Constitutional:      Appearance: Normal appearance.  Pulmonary:     Effort: Pulmonary effort is normal.  Skin:    General: Skin  is warm and dry.  Neurological:     General: No focal deficit present.     Mental Status: He is alert and oriented to person, place, and time.  Psychiatric:        Mood and Affect: Mood normal.        Behavior: Behavior normal.     Ortho Exam Examination he has reproducible symptoms with extension and turning his neck to the right with associated stiffness.  No step-offs on palpation does have some moderate spasm.  Has good grip strength good biceps triceps abductor strength.  Sensation grossly intact distally. Specialty Comments:  No specialty comments available.  Imaging: No results found.   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   Severe obstructive sleep apnea 02/21/2018   Excessive daytime sleepiness 02/21/2018   Hypoventilation associated with obesity syndrome (HCC) 02/21/2018   Nasal obstruction 02/21/2018   History of gout  04/11/2016   Prediabetes 04/11/2016   Nocturia 04/11/2016   Left knee pain 04/11/2016   Encounter for long-term (current) use of other medications 02/05/2013   HTN (hypertension) 01/20/2013   Obesity, Class III, BMI 40-49.9 (morbid obesity) (HCC) 01/20/2013   Past Medical History:  Diagnosis Date   Hypertension     Family History  Problem Relation Age of Onset   Hypertension Mother    Diabetes Father    Hypertension Father    Hypertension Sister     History reviewed. No pertinent surgical history. Social History   Occupational History   Occupation: Sports Administrator: [employer]  Tobacco Use   Smoking status: Never   Smokeless tobacco: Never  Vaping Use   Vaping status: Never Used  Substance and Sexual Activity   Alcohol  use: No   Drug use: No   Sexual activity: Yes

## 2024-05-13 ENCOUNTER — Encounter: Payer: Self-pay | Admitting: Radiology

## 2024-05-13 ENCOUNTER — Encounter: Payer: Self-pay | Admitting: Internal Medicine

## 2024-05-13 DIAGNOSIS — I10 Essential (primary) hypertension: Secondary | ICD-10-CM

## 2024-05-14 MED ORDER — DOXAZOSIN MESYLATE 8 MG PO TABS: 16.0000 mg | ORAL_TABLET | Freq: Every day | ORAL | 3 refills | Status: AC

## 2024-05-14 MED ORDER — HYDRALAZINE HCL 50 MG PO TABS: 50.0000 mg | ORAL_TABLET | Freq: Two times a day (BID) | ORAL | 3 refills | Status: AC

## 2024-05-14 MED ORDER — CARVEDILOL 25 MG PO TABS: 25.0000 mg | ORAL_TABLET | Freq: Two times a day (BID) | ORAL | 3 refills | Status: AC

## 2024-05-14 MED ORDER — ATORVASTATIN CALCIUM 20 MG PO TABS: 20.0000 mg | ORAL_TABLET | Freq: Every day | ORAL | 3 refills | Status: AC

## 2024-05-21 ENCOUNTER — Ambulatory Visit: Admitting: Physical Therapy

## 2024-05-21 ENCOUNTER — Encounter: Payer: Self-pay | Admitting: Physical Therapy

## 2024-05-21 DIAGNOSIS — M542 Cervicalgia: Secondary | ICD-10-CM

## 2024-05-21 DIAGNOSIS — M6281 Muscle weakness (generalized): Secondary | ICD-10-CM

## 2024-05-21 NOTE — Therapy (Addendum)
 " OUTPATIENT PHYSICAL THERAPY CERVICAL EVALUATION Discharge   Patient Name: Derrick Cain. MRN: [id number] DOB:[birth year], 53 y.o., male Today's Date: 05/21/2024  END OF SESSION: Visit # 1 Total visits: 20 Re-cert date: 8/76/73 Session time: 40 minutes   Past Medical History:  Diagnosis Date   Hypertension    No past surgical history on file. Patient Active Problem List   Diagnosis Date Noted   Sleep disorder, shift-work 08/28/2018   OSA on CPAP 08/28/2018   Pain of joint of left ankle and foot 06/13/2018   Wrist pain, chronic, left 06/13/2018   CKD (chronic kidney disease) stage 3, GFR 30-59 ml/min (HCC) 05/02/2018   Circadian rhythm sleep disorder, shift work type 02/21/2018   Severe obstructive sleep apnea 02/21/2018   Excessive daytime sleepiness 02/21/2018   Hypoventilation associated with obesity syndrome (HCC) 02/21/2018   Nasal obstruction 02/21/2018   History of gout 04/11/2016   Prediabetes 04/11/2016   Nocturia 04/11/2016   Left knee pain 04/11/2016   Encounter for long-term (current) use of other medications 02/05/2013   HTN (hypertension) 01/20/2013   Obesity, Class III, BMI 40-49.9 (morbid obesity) (HCC) 01/20/2013     PCP: Pcp, No   REFERRING PROVIDER: Persons, Ronal Dragon, PA   REFERRING DIAG:  Diagnosis  M54.2 (ICD-10-CM) - Neck pain    THERAPY DIAG:  No diagnosis found.  Rationale for Evaluation and Treatment: Rehabilitation  ONSET DATE: for almost a year  SUBJECTIVE:                                                                                                                                                                                                         SUBJECTIVE STATEMENT: Pt arriving for PT evaluation of neck pain. Pt states that he thinks he was sleeping wrong. Pt reported getting new pillows which seem to help a little bit. X-rays demonstrate degenerative changes at C5-6 C6-7 and he has pain that goes down into his right  shoulder right arm   PERTINENT HISTORY:  See PMH above  PAIN:  NPRS scale: 4/10 Pain location: Rt side cervical pain, numbness from shoulder to elbow Pain description: achy, burning, stabbing, throbbing Aggravating factors: sleeping positions, reaching, positioning Relieving factors: changing positions  PRECAUTIONS: None  RED FLAGS: None  WEIGHT BEARING RESTRICTIONS: No  FALLS:  Has patient fallen in last 6 months? No  LIVING ENVIRONMENT: Lives with: lives with their family and lives with their spouse Lives in: House/apartment Stairs: No Has following equipment at home: None  OCCUPATION: curator  PLOF: Independent  PATIENT GOALS: Stop hurting,  work without pain  Next MD visit:  OBJECTIVE:   DIAGNOSTIC FINDINGS:  X-rays today demonstrate degenerative changes at C5-6 C6-7  PATIENT SURVEYS: Patient-Specific Activity Scoring Scheme  0 represents unable to perform. 10 represents able to perform at prior level. 0 1 2 3 4 5 6 7 8 9  10 (Date and Score)   Activity Eval  05/21/24    1. Extended use of Rt arm/hand  8    2. Lying down on Rt side for longer periods  3    3. Rotating head to right 4   4.    5.    Score 5    Total score = sum of the activity scores/number of activities Minimum detectable change (90%CI) for average score = 2 points Minimum detectable change (90%CI) for single activity score = 3 points    COGNITION: Overall cognitive status: Within functional limits for tasks assessed  SENSATION: WFL  POSTURE:  05/21/24 rounded shoulders, forward head, and increased lumbar lordosis  PALPATION: TTP; tightness noted in bil upper trap, bil paraspinals   CERVICAL ROM:   ROM AROM (deg) Eval 05/21/24  Flexion 34  Extension 40 c pain  Right lateral flexion 36  Left lateral flexion 20  Right rotation 38  Left rotation 52   (Blank rows = not tested)  UPPER EXTREMITY ROM:   ROM Right Eval 05/21/24 Left Eval 05/21/24   Shoulder flexion [facility]  Shoulder extension    Shoulder abduction [facility]  Shoulder adduction    Shoulder extension    Shoulder internal rotation    Shoulder external rotation WFL WFL   (Blank rows = not tested)  UPPER EXTREMITY MMT:  MMT Right Eval 05/21/24 Left Eval 05/21/24  Shoulder flexion [facility]  Shoulder extension    Shoulder abduction    Shoulder adduction    Shoulder extension    Shoulder internal rotation    Shoulder external rotation    Middle trapezius    Lower trapezius     (Blank rows = not tested)                                                                                                                                                                              TODAY'S TREATMENT:  DATE:  Therex:    HEP instruction/performance c cues for techniques, handout provided.  Trial set performed of each for comprehension and symptom assessment.  See below for exercise list  PATIENT EDUCATION:  Education details: HEP, POC Person educated: Patient Education method: Explanation, Demonstration, Verbal cues, and Handouts Education comprehension: verbalized understanding, returned demonstration, and verbal cues required  HOME EXERCISE PROGRAM: Access Code: [email or web address] URL: [email or web address] Date: 05/21/2024 Prepared by: Delon Lunger  Exercises - Radial Nerve Flossing  - 2-3 x daily - 7 x weekly - 10 reps - Median Nerve Flossing - Tray  - 2-3 x daily - 7 x weekly - 10 reps - Ulnar Nerve Flossing  - 2-3 x daily - 7 x weekly - 10 reps - Gentle Levator Scapulae Stretch  - 2-3 x daily - 7 x weekly - 3-4 reps - 10 seconds hold - Standing Shoulder Row with Anchored Resistance  - 2-3 x daily - 7 x weekly - 2 sets - 15 reps - 3 second hold - Supine Cervical Retraction with Towel  - 2-3 x daily - 7 x weekly - 2 sets - 10 reps - 5 seconds  hold  ASSESSMENT:  CLINICAL IMPRESSION: Patient is a 53 y.o. who comes to clinic with complaints of cervical pain with mobility, strength and movement coordination deficits that impair their ability to perform usual daily and recreational functional activities without increase difficulty/symptoms at this time.  Patient to benefit from skilled PT services to address impairments and limitations to improve to previous level of function without restriction secondary to condition.    OBJECTIVE IMPAIRMENTS: decreased mobility, decreased ROM, impaired UE functional use, postural dysfunction, and obesity.   ACTIVITY LIMITATIONS: carrying, sleeping, bed mobility, and reach over head  PARTICIPATION LIMITATIONS: occupation  PERSONAL FACTORS: see PMH above are also affecting patient's functional outcome.   REHAB POTENTIAL: Good  CLINICAL DECISION MAKING: Stable/uncomplicated  EVALUATION COMPLEXITY: Low   GOALS: Goals reviewed with patient? Yes  SHORT TERM GOALS: (target date for Short term goals are 3 weeks 06/11/2024)  1.Patient will demonstrate independent use of home exercise program to maintain progress from in clinic treatments. Goal status: New  LONG TERM GOALS: (target dates for all long term goals are 10 weeks  07/30/2024 )   1. Patient will demonstrate/report pain at worst less than or equal to 2/10 to facilitate minimal limitation in daily activity secondary to pain symptoms. Goal status: New   2. Patient will demonstrate independent use of home exercise program to facilitate ability to maintain/progress functional gains from skilled physical therapy services. Goal status: New   3. Patient will demonstrate Patient specific functional scale avg > or = 7 to indicate reduced disability due to condition.  Goal status: New   4.  Patient will demonstrate cervical rotation to >/= 50 degrees bilaterally s symptoms to facilitate usual head movements for daily activity including driving,  self care.   Goal status: New   5.  Pt will improve cervical  flexion to >/= 40 deg with no pain reported for improved ADLs.  Goal status: New         PLAN:  PT FREQUENCY: 1-2x/week  PT DURATION: 10 weeks  Can include 02853- PT Re-evaluation, 97110-Therapeutic exercises, 97530- Therapeutic activity, [id number]- Neuromuscular re-education, 97535- Self Care, 97140- Manual therapy, [id number]- Gait training, [phone]- Orthotic Fit/training, [phone]- Canalith repositioning, [id number]- Aquatic Therapy, [phone]- Electrical stimulation (unattended), [id number] Physical performance testing, 97016- Vasopneumatic device, [id number]- Ultrasound, [id number]- Traction (mechanical), [id number]- Ionotophoresis 4mg /ml Dexamethasone,  [id number] -  Needle insertion w/o injection 1 or 2 muscles, 20561 - Needle insertion w/o injection 3 or more muscles.   Patient/Family education, Balance training, Stair training, Taping, Dry Needling, Joint mobilization, Joint manipulation, Spinal manipulation, Spinal mobilization, Scar mobilization, Vestibular training, Visual/preceptual remediation/compensation, DME instructions, Cryotherapy, and Moist heat.  All performed as medically necessary.  All included unless contraindicated  PLAN FOR NEXT SESSION: Check HEP use/response, cervical ROM, consider DN, handout issued at eval     Delon JONELLE Lunger, PT, MPT 05/21/2024, 7:57 AM   PHYSICAL THERAPY DISCHARGE SUMMARY  Visits from Start of Care: 1  Current functional level related to goals / functional outcomes: See above   Remaining deficits: See above   Education / Equipment: HEP   Patient agrees to discharge. Patient goals were not met. Patient is being discharged due to not returning since the last visit.    "

## 2024-05-21 NOTE — Patient Instructions (Signed)

## 2024-05-23 NOTE — Progress Notes (Unsigned)
 Patient ID: Derrick Cain.                 DOB: 04-21-1971                    MRN: [id number]     HPI: Derrick Cain. is a 53 y.o. male patient referred to pharmacy clinic by Dr. Loni to initiate GLP1-RA therapy. PMH is significant for HTN, OSA (diagnosed in 2018), HLD, RBBB, prediabetes and obesity. Most recent BMI 44.3.  Patient was last seen in office by Dr. Loni on 04/11/2024. During this visit, patient had gained weight which was associated with depression from losing several members (including his father) around Thanksgiving last year. He has an OSA diagnosis from a split sleep study on 04/20/17 where his AHI was 38.2. Last A1c was 5.8 on 04/11/2016.  Baseline weight and BMI: 383 lbs BMI =44.3 Current meds that affect weight: none  Patient presents to clinic today  Diet:   Exercise:   Family History:   Social History:  Tobacco: Never Alcohol:  Labs: Lab Results  Component Value Date   HGBA1C 5.8 (H) 04/11/2016    Wt Readings from Last 1 Encounters:  04/11/24 (!) 383 lb 3.2 oz (173.8 kg)    BP Readings from Last 1 Encounters:  04/11/24 (!) 152/92   Pulse Readings from Last 1 Encounters:  04/11/24 68       Component Value Date/Time   CHOL 162 05/02/2018 1635   TRIG 109 05/02/2018 1635   HDL 32 (L) 05/02/2018 1635   CHOLHDL 5.1 (H) 05/02/2018 1635   CHOLHDL 5.1 (H) 04/11/2016 1017   VLDL 26 04/11/2016 1017   LDLCALC 108 (H) 05/02/2018 1635    Past Medical History:  Diagnosis Date   Hypertension     Current Outpatient Medications on File Prior to Visit  Medication Sig Dispense Refill   acetaminophen  (TYLENOL ) 500 MG tablet Take 1 tablet (500 mg total) every 6 (six) hours as needed by mouth. 30 tablet 0   amLODipine  (NORVASC ) 10 MG tablet Take 1 tablet (10 mg total) by mouth daily. 90 tablet 3   atorvastatin  (LIPITOR) 20 MG tablet Take 1 tablet (20 mg total) by mouth daily. 90 tablet 3   carvedilol  (COREG ) 25 MG tablet Take 1 tablet (25 mg total)  by mouth 2 (two) times daily with a meal. 180 tablet 3   colchicine  0.6 MG tablet Take 2 tablets (1.2mg ) at onset of gout, then 1 tablet twice daily until pain subsides, no more than 7 consecutive days 30 tablet 0   diclofenac  (VOLTAREN ) 50 MG EC tablet Take 1 tablet (50 mg total) by mouth 2 (two) times daily. 30 tablet 1   doxazosin  (CARDURA ) 8 MG tablet Take 2 tablets (16 mg total) by mouth daily. 180 tablet 3   hydrALAZINE  (APRESOLINE ) 50 MG tablet Take 1 tablet (50 mg total) by mouth 2 (two) times daily. 180 tablet 3   lisinopril  (ZESTRIL ) 40 MG tablet Take 1 tablet (40 mg total) by mouth daily. ls 90 tablet 3   spironolactone (ALDACTONE) 50 MG tablet Take 50 mg by mouth daily.     No current facility-administered medications on file prior to visit.    No Known Allergies   Assessment/Plan:  1. Weight loss - Patient has not met goal of at least 5% of body weight loss with comprehensive lifestyle modifications alone in the past 3-6 months. Pharmacotherapy is appropriate to pursue as augmentation. Will start*** . Confirmed  patient not ***pregnant and no personal or family history of medullary thyroid  carcinoma (MTC) or Multiple Endocrine Neoplasia syndrome type 2 (MEN 2). Injection technique reviewed at today's visit.  Advised patient on common side effects including nausea, diarrhea, dyspepsia, decreased appetite, and fatigue. Counseled patient on reducing meal size and how to titrate medication to minimize side effects. Counseled patient to call if intolerable side effects or if experiencing dehydration, abdominal pain, or dizziness. Along with pharmacotherapy, the patient will follow dietary modifications and aim for at least 150 minutes of moderate-intensity exercise per week, plus resistance training twice a week (as recommended by the American Heart Association). This resistance training--such as weightlifting, bodyweight exercises, or using resistance bands, adapted to the patient's  ability--will help prevent muscle loss.  Follow up in 1-2 days regarding coverage of *** . If therapy is initiated, phone follow-ups will be conducted every 4 weeks for dose titration until the patient reaches the effective therapeutic dose and target weight.   WENDI Amon Rocher, PharmD PGY-1 Pharmacy Resident [facility] 05/23/2024 10:41 AM  Melissa D Maccia, Pharm.D, BCACP, BCPS, CPP [facility] A Division of [facility] [address], [place], [place] [address]  Phone: [phone]; Fax: [phone]

## 2024-05-24 ENCOUNTER — Ambulatory Visit: Admitting: Pharmacist

## 2024-05-31 ENCOUNTER — Ambulatory Visit: Admitting: Family

## 2024-05-31 ENCOUNTER — Encounter: Payer: Self-pay | Admitting: Family

## 2024-05-31 VITALS — BP 120/78 | HR 60 | Temp 97.8°F | Resp 16 | Ht 78.0 in | Wt 384.2 lb

## 2024-05-31 DIAGNOSIS — Z130 Encounter for screening for diseases of the blood and blood-forming organs and certain disorders involving the immune mechanism: Secondary | ICD-10-CM

## 2024-05-31 DIAGNOSIS — Z13228 Encounter for screening for other metabolic disorders: Secondary | ICD-10-CM

## 2024-05-31 DIAGNOSIS — Z1329 Encounter for screening for other suspected endocrine disorder: Secondary | ICD-10-CM

## 2024-05-31 DIAGNOSIS — Z131 Encounter for screening for diabetes mellitus: Secondary | ICD-10-CM

## 2024-05-31 DIAGNOSIS — Z0000 Encounter for general adult medical examination without abnormal findings: Secondary | ICD-10-CM | POA: Diagnosis not present

## 2024-05-31 DIAGNOSIS — Z1159 Encounter for screening for other viral diseases: Secondary | ICD-10-CM

## 2024-05-31 DIAGNOSIS — Z7689 Persons encountering health services in other specified circumstances: Secondary | ICD-10-CM

## 2024-05-31 DIAGNOSIS — Z1211 Encounter for screening for malignant neoplasm of colon: Secondary | ICD-10-CM

## 2024-05-31 NOTE — Progress Notes (Signed)
 Have not had a physical in a while.  Patient concern about general health

## 2024-05-31 NOTE — Progress Notes (Signed)
 Subjective:    Derrick Cain. - 53 y.o. male MRN [id number]  Date of birth: 14-Sep-1970  HPI  Derrick Cain. is to establish care and annual physical exam.  Current issues and/or concerns: - Due for colon cancer screening. - Established with Cardiology.  - Established with Nephrology.  - Established with Orthopedics and Physical Therapy.   ROS per HPI   Health Maintenance:  Health Maintenance Due  Topic Date Due   Hepatitis C Screening  Never done   Colonoscopy  Never done   Influenza Vaccine  Never done     Past Medical History: Patient Active Problem List   Diagnosis Date Noted   Sleep disorder, shift-work 08/28/2018   OSA on CPAP 08/28/2018   Pain of joint of left ankle and foot 06/13/2018   Wrist pain, chronic, left 06/13/2018   CKD (chronic kidney disease) stage 3, GFR 30-59 ml/min (HCC) 05/02/2018   Circadian rhythm sleep disorder, shift work type 02/21/2018   Severe obstructive sleep apnea 02/21/2018   Excessive daytime sleepiness 02/21/2018   Hypoventilation associated with obesity syndrome (HCC) 02/21/2018   Nasal obstruction 02/21/2018   History of gout 04/11/2016   Prediabetes 04/11/2016   Nocturia 04/11/2016   Left knee pain 04/11/2016   Encounter for long-term (current) use of other medications 02/05/2013   HTN (hypertension) 01/20/2013   Obesity, Class III, BMI 40-49.9 (morbid obesity) (HCC) 01/20/2013      Social History   reports that he has never smoked. He has never used smokeless tobacco. He reports that he does not drink alcohol and does not use drugs.   Family History  family history includes Diabetes in his father; Hypertension in his father, mother, and sister.   Medications: reviewed and updated   Objective:   Physical Exam BP 120/78   Pulse 60   Temp 97.8 F (36.6 C) (Oral)   Resp 16   Ht 6' 6 (1.981 m)   Wt (!) 384 lb 3.2 oz (174.3 kg)   SpO2 96%   BMI 44.40 kg/m   Physical Exam HENT:     Head: Normocephalic and  atraumatic.     Right Ear: Tympanic membrane, ear canal and external ear normal.     Left Ear: Tympanic membrane, ear canal and external ear normal.     Nose: Nose normal.     Mouth/Throat:     Mouth: Mucous membranes are moist.     Pharynx: Oropharynx is clear.  Eyes:     Extraocular Movements: Extraocular movements intact.     Conjunctiva/sclera: Conjunctivae normal.     Pupils: Pupils are equal, round, and reactive to light.  Neck:     Thyroid : No thyroid  mass, thyromegaly or thyroid  tenderness.  Cardiovascular:     Rate and Rhythm: Normal rate and regular rhythm.     Pulses: Normal pulses.     Heart sounds: Normal heart sounds.  Pulmonary:     Effort: Pulmonary effort is normal.     Breath sounds: Normal breath sounds.  Abdominal:     General: Bowel sounds are normal.     Palpations: Abdomen is soft.  Genitourinary:    Comments: Patient declined. Musculoskeletal:        General: Normal range of motion.     Right shoulder: Normal.     Left shoulder: Normal.     Right upper arm: Normal.     Left upper arm: Normal.     Right elbow: Normal.     Left elbow:  Normal.     Right forearm: Normal.     Left forearm: Normal.     Right wrist: Normal.     Left wrist: Normal.     Right hand: Normal.     Left hand: Normal.     Cervical back: Normal, normal range of motion and neck supple.     Thoracic back: Normal.     Lumbar back: Normal.     Right hip: Normal.     Left hip: Normal.     Right upper leg: Normal.     Left upper leg: Normal.     Right knee: Normal.     Left knee: Normal.     Right lower leg: Normal.     Left lower leg: Normal.     Right ankle: Normal.     Left ankle: Normal.     Right foot: Normal.     Left foot: Normal.  Skin:    General: Skin is warm and dry.     Capillary Refill: Capillary refill takes less than 2 seconds.  Neurological:     General: No focal deficit present.     Mental Status: He is alert and oriented to person, place, and time.   Psychiatric:        Mood and Affect: Mood normal.        Behavior: Behavior normal.       Assessment & Plan:  1. Encounter to establish care (Primary) 2. Annual physical exam - Counseled on 150 minutes of exercise per week as tolerated, healthy eating (including decreased daily intake of saturated fats, cholesterol, added sugars, sodium), STI prevention, and routine healthcare maintenance.  3. Screening for metabolic disorder - Routine screening.  - Hepatic Function Panel  4. Screening for deficiency anemia - Routine screening.  - CBC  5. Diabetes mellitus screening - Routine screening.  - Hemoglobin A1c  6. Thyroid  disorder screen - Routine screening.  - TSH  7. Need for hepatitis C screening test - Routine screening.  - Hepatitis C Antibody  8. Colon cancer screening - Referral to Gastroenterology for colon cancer screening by colonoscopy. - Ambulatory referral to Gastroenterology    Patient was given clear instructions to go to Emergency Department or return to medical center if symptoms don't improve, worsen, or new problems develop.The patient verbalized understanding.  I discussed the assessment and treatment plan with the patient. The patient was provided an opportunity to ask questions and all were answered. The patient agreed with the plan and demonstrated an understanding of the instructions.   The patient was advised to call back or seek an in-person evaluation if the symptoms worsen or if the condition fails to improve as anticipated.    Greig Drones, NP 05/31/2024, 9:28 AM Primary Care at [facility]

## 2024-06-01 LAB — HEPATIC FUNCTION PANEL
ALT: 20 IU/L (ref 0–44)
AST: 21 IU/L (ref 0–40)
Albumin: 4 g/dL (ref 3.8–4.9)
Alkaline Phosphatase: 101 IU/L (ref 47–123)
Bilirubin Total: 0.4 mg/dL (ref 0.0–1.2)
Bilirubin, Direct: 0.14 mg/dL (ref 0.00–0.40)
Total Protein: 7.1 g/dL (ref 6.0–8.5)

## 2024-06-01 LAB — CBC
Hematocrit: 40.6 % (ref 37.5–51.0)
Hemoglobin: 13.2 g/dL (ref 13.0–17.7)
MCH: 27.2 pg (ref 26.6–33.0)
MCHC: 32.5 g/dL (ref 31.5–35.7)
MCV: 84 fL (ref 79–97)
Platelets: 196 x10E3/uL (ref 150–450)
RBC: 4.86 x10E6/uL (ref 4.14–5.80)
RDW: 15.1 % (ref 11.6–15.4)
WBC: 5.2 x10E3/uL (ref 3.4–10.8)

## 2024-06-01 LAB — HEPATITIS C ANTIBODY: Hep C Virus Ab: NONREACTIVE

## 2024-06-01 LAB — HEMOGLOBIN A1C
Est. average glucose Bld gHb Est-mCnc: 114 mg/dL
Hgb A1c MFr Bld: 5.6 % (ref 4.8–5.6)

## 2024-06-01 LAB — TSH: TSH: 1.34 u[IU]/mL (ref 0.450–4.500)

## 2024-06-03 ENCOUNTER — Ambulatory Visit: Payer: Self-pay | Admitting: Family

## 2024-06-04 ENCOUNTER — Encounter: Admitting: Rehabilitative and Restorative Service Providers"

## 2024-06-10 ENCOUNTER — Encounter: Admitting: Physical Therapy

## 2024-06-17 ENCOUNTER — Encounter: Admitting: Physical Therapy

## 2024-06-24 ENCOUNTER — Encounter: Admitting: Physical Therapy

## 2024-06-28 ENCOUNTER — Ambulatory Visit: Payer: Self-pay | Admitting: Internal Medicine

## 2024-06-28 ENCOUNTER — Ambulatory Visit (HOSPITAL_COMMUNITY)
Admission: RE | Admit: 2024-06-28 | Discharge: 2024-06-28 | Disposition: A | Discharge status: Home / Self Care | Source: Ambulatory Visit | Attending: Internal Medicine | Admitting: Internal Medicine

## 2024-06-28 DIAGNOSIS — R9431 Abnormal electrocardiogram [ECG] [EKG]: Secondary | ICD-10-CM

## 2024-06-28 DIAGNOSIS — G4733 Obstructive sleep apnea (adult) (pediatric): Secondary | ICD-10-CM

## 2024-06-28 DIAGNOSIS — I10 Essential (primary) hypertension: Secondary | ICD-10-CM

## 2024-06-28 LAB — ECHOCARDIOGRAM COMPLETE
AR max vel: 2.61 cm2
AV Area VTI: 2.91 cm2
AV Area mean vel: 2.7 cm2
AV Mean grad: 7.1 mmHg
AV Peak grad: 15.7 mmHg
Ao pk vel: 1.98 m/s
Area-P 1/2: 3.63 cm2
S' Lateral: 2.5 cm

## 2024-07-01 ENCOUNTER — Encounter: Admitting: Physical Therapy

## 2024-07-08 ENCOUNTER — Encounter: Admitting: Physical Therapy

## 2024-07-15 ENCOUNTER — Encounter: Admitting: Physical Therapy

## 2024-07-16 NOTE — Progress Notes (Unsigned)
 Patient ID: Derrick Cain.                 DOB: September 18, 1970                    MRN: [id number]     HPI: Derrick Sikora. is a 54 y.o. male patient referred to pharmacy clinic by Dr. Loni to initiate GLP1-RA therapy. PMH is significant for HTN, CKD Stage III, Obesity, pre-diabetes, OSA (CPAP, AHI 38.2), and obesity. Most recent BMI 44.4 kg/m .  Most recent visit with Dr. Loni on 04/12/2024 for resistant hypertension with no medications changes. Potential for obesity and most recent weight gain to be affecting hypertension.   Baseline weight and BMI: 384 lb, 44.4 kg/m  Current weight and BMI: *** kg/m  Current meds that affect weight: N/A ***  Commercially insured through [employer], never attempted prior authorization with weight loss medications ***  Diet:   Exercise:   Family History:  Mother: HTN Father: HTN, DM Sister: HTN  Social History:  No history of tobacco use EtOH: ***  Labs: Lab Results  Component Value Date   HGBA1C 5.6 05/31/2024    Wt Readings from Last 1 Encounters:  05/31/24 (!) 384 lb 3.2 oz (174.3 kg)    BP Readings from Last 1 Encounters:  05/31/24 120/78   Pulse Readings from Last 1 Encounters:  05/31/24 60       Component Value Date/Time   CHOL 162 05/02/2018 1635   TRIG 109 05/02/2018 1635   HDL 32 (L) 05/02/2018 1635   CHOLHDL 5.1 (H) 05/02/2018 1635   CHOLHDL 5.1 (H) 04/11/2016 1017   VLDL 26 04/11/2016 1017   LDLCALC 108 (H) 05/02/2018 1635    Past Medical History:  Diagnosis Date   Hypertension     Medications Ordered Prior to Encounter[1]  Allergies[2]   Assessment/Plan:  1. Weight loss - Patient has not met goal of at least 5% of body weight loss with comprehensive lifestyle modifications alone in the past 3-6 months. Pharmacotherapy is appropriate to pursue as augmentation. Will start*** . Confirmed patient not ***pregnant and no personal or family history of medullary thyroid  carcinoma (MTC) or Multiple Endocrine  Neoplasia syndrome type 2 (MEN 2). Confirmed no history of gallstones or pancreatitis. Injection technique reviewed at today's visit.  Advised patient on common side effects including nausea, diarrhea, dyspepsia, decreased appetite, and fatigue. Counseled patient on reducing meal size and how to titrate medication to minimize side effects. Counseled patient to call if intolerable side effects or if experiencing dehydration, abdominal pain, or dizziness. Along with pharmacotherapy, the patient will follow dietary modifications and aim for at least 150 minutes of moderate-intensity exercise per week, plus resistance training twice a week (as recommended by the American Heart Association). This resistance training--such as weightlifting, bodyweight exercises, or using resistance bands, adapted to the patients ability--will help prevent muscle loss.  Follow up in 1-2 days regarding coverage of *** . If therapy is initiated, phone follow-ups will be conducted every 4 weeks for dose titration until the patient reaches the effective therapeutic dose and target weight.  ***  Derrick Cain, Pharm.Derrick Cain, CPP [facility] of [facility] [address], [place], [place] [address]  Phone: [phone]; Fax: [phone]       [1]  Current Outpatient Medications on File Prior to Visit  Medication Sig Dispense Refill   acetaminophen  (TYLENOL ) 500 MG tablet Take 1 tablet (500 mg  total) every 6 (six) hours as needed by mouth. 30 tablet 0   amLODipine  (NORVASC ) 10 MG tablet Take 1 tablet (10 mg total) by mouth daily. 90 tablet 3   atorvastatin  (LIPITOR) 20 MG tablet Take 1 tablet (20 mg total) by mouth daily. 90 tablet 3   carvedilol  (COREG ) 25 MG tablet Take 1 tablet (25 mg total) by mouth 2 (two) times daily with a meal. 180 tablet 3   colchicine  0.6 MG tablet Take 2 tablets (1.2mg ) at onset of gout, then 1 tablet twice daily until pain subsides, no more than 7  consecutive days 30 tablet 0   diclofenac  (VOLTAREN ) 50 MG EC tablet Take 1 tablet (50 mg total) by mouth 2 (two) times daily. 30 tablet 1   doxazosin  (CARDURA ) 8 MG tablet Take 2 tablets (16 mg total) by mouth daily. 180 tablet 3   hydrALAZINE  (APRESOLINE ) 50 MG tablet Take 1 tablet (50 mg total) by mouth 2 (two) times daily. 180 tablet 3   lisinopril  (ZESTRIL ) 40 MG tablet Take 1 tablet (40 mg total) by mouth daily. ls 90 tablet 3   spironolactone (ALDACTONE) 50 MG tablet Take 50 mg by mouth daily.     No current facility-administered medications on file prior to visit.  [2] No Known Allergies

## 2024-07-17 ENCOUNTER — Ambulatory Visit: Attending: Cardiovascular Disease | Admitting: Pharmacist

## 2024-07-17 VITALS — Wt 387.0 lb

## 2024-07-17 DIAGNOSIS — E66813 Obesity, class 3: Secondary | ICD-10-CM

## 2024-07-17 NOTE — Patient Instructions (Signed)

## 2024-07-18 ENCOUNTER — Telehealth: Payer: Self-pay | Admitting: Pharmacy Technician

## 2024-07-18 ENCOUNTER — Other Ambulatory Visit (HOSPITAL_COMMUNITY): Payer: Self-pay

## 2024-07-18 NOTE — Telephone Encounter (Signed)
 Pharmacy Patient Advocate Encounter   Received notification from Physician's Office that prior authorization for zepbound is required/requested.   Insurance verification completed.   The patient is insured through [facility] [employer].   Per test claim: The current 07/18/24 day co-pay is, $1040.81  one month.  No PA needed at this time. This test claim was processed through [facility]- copay amounts may vary at other pharmacies due to pharmacy/plan contracts, or as the patient moves through the different stages of their insurance plan.

## 2024-07-19 ENCOUNTER — Encounter: Payer: Self-pay | Admitting: Pharmacist

## 2024-07-22 ENCOUNTER — Encounter: Admitting: Physical Therapy

## 2024-07-29 ENCOUNTER — Telehealth: Payer: Self-pay | Admitting: Physical Therapy

## 2024-07-29 ENCOUNTER — Encounter: Admitting: Physical Therapy

## 2024-07-29 NOTE — Therapy (Unsigned)
 " OUTPATIENT PHYSICAL THERAPY CERVICAL EVALUATION   Patient Name: Derrick Cain. MRN: [id number] DOB:[birth year], 54 y.o., male Today's Date: 07/29/2024  END OF SESSION:   Past Medical History:  Diagnosis Date   Hypertension    No past surgical history on file. Patient Active Problem List   Diagnosis Date Noted   Sleep disorder, shift-work 08/28/2018   OSA on CPAP 08/28/2018   Pain of joint of left ankle and foot 06/13/2018   Wrist pain, chronic, left 06/13/2018   CKD (chronic kidney disease) stage 3, GFR 30-59 ml/min (HCC) 05/02/2018   Circadian rhythm sleep disorder, shift work type 02/21/2018   Severe obstructive sleep apnea 02/21/2018   Excessive daytime sleepiness 02/21/2018   Hypoventilation associated with obesity syndrome (HCC) 02/21/2018   Nasal obstruction 02/21/2018   History of gout 04/11/2016   Prediabetes 04/11/2016   Nocturia 04/11/2016   Left knee pain 04/11/2016   Encounter for long-term (current) use of other medications 02/05/2013   HTN (hypertension) 01/20/2013   Obesity, Class III, BMI 40-49.9 (morbid obesity) (HCC) 01/20/2013     PCP: Jaycee Greig PARAS, NP   REFERRING PROVIDER: No ref. provider found   REFERRING DIAG:  Diagnosis  M54.2 (ICD-10-CM) - Neck pain    THERAPY DIAG:  No diagnosis found.  Rationale for Evaluation and Treatment: Rehabilitation  ONSET DATE: for almost a year  SUBJECTIVE:                                                                                                                                                                                                         SUBJECTIVE STATEMENT:   PERTINENT HISTORY:  See PMH above  PAIN:  NPRS scale: 4/10 Pain location: Rt side cervical pain, numbness from shoulder to elbow Pain description: achy, burning, stabbing, throbbing Aggravating factors: sleeping positions, reaching, positioning Relieving factors: changing positions  PRECAUTIONS: None  RED  FLAGS: None  WEIGHT BEARING RESTRICTIONS: No  FALLS:  Has patient fallen in last 6 months? No  LIVING ENVIRONMENT: Lives with: lives with their family and lives with their spouse Lives in: House/apartment Stairs: No Has following equipment at home: None  OCCUPATION: curator  PLOF: Independent  PATIENT GOALS: Stop hurting, work without pain  Next MD visit:  OBJECTIVE:   DIAGNOSTIC FINDINGS:  X-rays today demonstrate degenerative changes at C5-6 C6-7  PATIENT SURVEYS: Patient-Specific Activity Scoring Scheme  0 represents unable to perform. 10 represents able to perform at prior level. 0 1 2 3 4 5 6 7 8 9  10 (Date and Score)   Activity Eval  05/21/24    1. Extended use of Rt arm/hand  8    2. Lying down on Rt side for longer periods  3    3. Rotating head to right 4   4.    5.    Score 5    Total score = sum of the activity scores/number of activities Minimum detectable change (90%CI) for average score = 2 points Minimum detectable change (90%CI) for single activity score = 3 points    COGNITION: Overall cognitive status: Within functional limits for tasks assessed  SENSATION: WFL  POSTURE:  05/21/24 rounded shoulders, forward head, and increased lumbar lordosis  PALPATION: TTP; tightness noted in bil upper trap, bil paraspinals   CERVICAL ROM:   ROM AROM (deg) Eval 05/21/24  Flexion 34  Extension 40 c pain  Right lateral flexion 36  Left lateral flexion 20  Right rotation 38  Left rotation 52   (Blank rows = not tested)  UPPER EXTREMITY ROM:   ROM Right Eval 05/21/24 Left Eval 05/21/24  Shoulder flexion [facility]  Shoulder extension    Shoulder abduction [facility]  Shoulder adduction    Shoulder extension    Shoulder internal rotation    Shoulder external rotation WFL WFL   (Blank rows = not tested)  UPPER EXTREMITY MMT:  MMT Right Eval 05/21/24 Left Eval 05/21/24  Shoulder flexion [facility]  Shoulder extension     Shoulder abduction    Shoulder adduction    Shoulder extension    Shoulder internal rotation    Shoulder external rotation    Middle trapezius    Lower trapezius     (Blank rows = not tested)                                                                                                                                                                              TODAY'S TREATMENT:                                                                                                       DATE:  Therex:    HEP instruction/performance c cues for techniques, handout provided.  Trial set performed of each for comprehension and symptom assessment.  See below for exercise list  PATIENT EDUCATION:  Education details: HEP, POC Person educated: Patient Education method: Explanation, Demonstration, Verbal cues, and Handouts Education comprehension: verbalized understanding, returned demonstration, and verbal cues required  HOME EXERCISE PROGRAM: Access Code: [email or web address] URL: [email or web address] Date: 05/21/2024 Prepared by: Delon Lunger  Exercises - Radial Nerve Flossing  - 2-3 x daily - 7 x weekly - 10 reps - Median Nerve Flossing - Tray  - 2-3 x daily - 7 x weekly - 10 reps - Ulnar Nerve Flossing  - 2-3 x daily - 7 x weekly - 10 reps - Gentle Levator Scapulae Stretch  - 2-3 x daily - 7 x weekly - 3-4 reps - 10 seconds hold - Standing Shoulder Row with Anchored Resistance  - 2-3 x daily - 7 x weekly - 2 sets - 15 reps - 3 second hold - Supine Cervical Retraction with Towel  - 2-3 x daily - 7 x weekly - 2 sets - 10 reps - 5 seconds hold  ASSESSMENT:  CLINICAL IMPRESSION:    OBJECTIVE IMPAIRMENTS: decreased mobility, decreased ROM, impaired UE functional use, postural dysfunction, and obesity.   ACTIVITY LIMITATIONS: carrying, sleeping, bed mobility, and reach over head  PARTICIPATION LIMITATIONS: occupation  PERSONAL FACTORS: see PMH above are also affecting patient's  functional outcome.   REHAB POTENTIAL: Good  CLINICAL DECISION MAKING: Stable/uncomplicated  EVALUATION COMPLEXITY: Low   GOALS: Goals reviewed with patient? Yes  SHORT TERM GOALS: (target date for Short term goals are 3 weeks 06/11/2024)  1.Patient will demonstrate independent use of home exercise program to maintain progress from in clinic treatments. Goal status: New  LONG TERM GOALS: (target dates for all long term goals are 10 weeks  07/30/2024 )   1. Patient will demonstrate/report pain at worst less than or equal to 2/10 to facilitate minimal limitation in daily activity secondary to pain symptoms. Goal status: New   2. Patient will demonstrate independent use of home exercise program to facilitate ability to maintain/progress functional gains from skilled physical therapy services. Goal status: New   3. Patient will demonstrate Patient specific functional scale avg > or = 7 to indicate reduced disability due to condition.  Goal status: New   4.  Patient will demonstrate cervical rotation to >/= 50 degrees bilaterally s symptoms to facilitate usual head movements for daily activity including driving, self care.   Goal status: New   5.  Pt will improve cervical  flexion to >/= 40 deg with no pain reported for improved ADLs.  Goal status: New         PLAN:  PT FREQUENCY: 1-2x/week  PT DURATION: 10 weeks  Can include 02853- PT Re-evaluation, 97110-Therapeutic exercises, 97530- Therapeutic activity, [id number]- Neuromuscular re-education, 97535- Self Care, 97140- Manual therapy, [phone]- Gait training, [phone]- Orthotic Fit/training, [phone]- Canalith repositioning, [id number]- Aquatic Therapy, [phone]- Electrical stimulation (unattended), [id number] Physical performance testing, 97016- Vasopneumatic device, [id number]- Ultrasound, [id number]- Traction (mechanical), [id number]- Ionotophoresis 4mg /ml Dexamethasone,  20560 - Needle insertion w/o injection 1 or 2 muscles, 20561 - Needle insertion w/o injection  3 or more muscles.   Patient/Family education, Balance training, Stair training, Taping, Dry Needling, Joint mobilization, Joint manipulation, Spinal manipulation, Spinal mobilization, Scar mobilization, Vestibular training, Visual/preceptual remediation/compensation, DME instructions, Cryotherapy, and Moist heat.  All performed as medically necessary.  All included unless contraindicated  PLAN FOR NEXT SESSION:  cervical ROM,  consider DN, handout issued at eval     Delon JONELLE Lunger, PT, MPT 07/29/2024, 1:37 PM      "

## 2024-07-29 NOTE — Telephone Encounter (Signed)
 I called pt to follow up after he didn't show for his 1:45 PT appointment. Pt stating he forgot about his appointment today. Pt reporting he is no longer having pain and we cancelled his only other scheduled PT appointment for next week. Pt is being discharged from skilled PT.  Delon Lunger, PT, MPT 07/29/24 5:16 PM

## 2024-08-05 ENCOUNTER — Encounter: Admitting: Physical Therapy

## 2025-06-02 ENCOUNTER — Encounter: Admitting: Family Medicine
